# Patient Record
Sex: Female | Born: 1935 | Race: White | Hispanic: No | Smoking: Former smoker
Health system: Southern US, Community
[De-identification: ages and names within clinical notes are randomized; demographics above are authoritative.]

## PROBLEM LIST (undated history)

## (undated) DIAGNOSIS — I509 Heart failure, unspecified: Secondary | ICD-10-CM

## (undated) DIAGNOSIS — M5136 Other intervertebral disc degeneration, lumbar region: Secondary | ICD-10-CM

## (undated) DIAGNOSIS — T8859XA Other complications of anesthesia, initial encounter: Secondary | ICD-10-CM

## (undated) DIAGNOSIS — M1990 Unspecified osteoarthritis, unspecified site: Secondary | ICD-10-CM

## (undated) DIAGNOSIS — R600 Localized edema: Secondary | ICD-10-CM

## (undated) DIAGNOSIS — T4145XA Adverse effect of unspecified anesthetic, initial encounter: Secondary | ICD-10-CM

## (undated) DIAGNOSIS — Z87442 Personal history of urinary calculi: Secondary | ICD-10-CM

## (undated) DIAGNOSIS — N3090 Cystitis, unspecified without hematuria: Secondary | ICD-10-CM

## (undated) DIAGNOSIS — K4090 Unilateral inguinal hernia, without obstruction or gangrene, not specified as recurrent: Secondary | ICD-10-CM

## (undated) DIAGNOSIS — Z973 Presence of spectacles and contact lenses: Secondary | ICD-10-CM

## (undated) DIAGNOSIS — K5790 Diverticulosis of intestine, part unspecified, without perforation or abscess without bleeding: Secondary | ICD-10-CM

## (undated) DIAGNOSIS — Z8719 Personal history of other diseases of the digestive system: Secondary | ICD-10-CM

## (undated) DIAGNOSIS — H409 Unspecified glaucoma: Secondary | ICD-10-CM

## (undated) DIAGNOSIS — I771 Stricture of artery: Secondary | ICD-10-CM

## (undated) DIAGNOSIS — R3915 Urgency of urination: Secondary | ICD-10-CM

## (undated) DIAGNOSIS — I6529 Occlusion and stenosis of unspecified carotid artery: Secondary | ICD-10-CM

## (undated) DIAGNOSIS — R351 Nocturia: Secondary | ICD-10-CM

## (undated) DIAGNOSIS — R51 Headache: Secondary | ICD-10-CM

## (undated) DIAGNOSIS — K2270 Barrett's esophagus without dysplasia: Secondary | ICD-10-CM

## (undated) DIAGNOSIS — K219 Gastro-esophageal reflux disease without esophagitis: Secondary | ICD-10-CM

## (undated) DIAGNOSIS — I2510 Atherosclerotic heart disease of native coronary artery without angina pectoris: Secondary | ICD-10-CM

## (undated) DIAGNOSIS — I6501 Occlusion and stenosis of right vertebral artery: Secondary | ICD-10-CM

## (undated) DIAGNOSIS — Z9889 Other specified postprocedural states: Secondary | ICD-10-CM

## (undated) HISTORY — DX: Atherosclerotic heart disease of native coronary artery without angina pectoris: I25.10

## (undated) HISTORY — PX: TUBAL LIGATION: SHX77

## (undated) HISTORY — DX: Gastro-esophageal reflux disease without esophagitis: K21.9

## (undated) HISTORY — DX: Unspecified osteoarthritis, unspecified site: M19.90

## (undated) HISTORY — DX: Heart failure, unspecified: I50.9

---
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
## 1966-05-07 HISTORY — PX: OTHER SURGICAL HISTORY: SHX169

## 1992-05-07 HISTORY — PX: CHOLECYSTECTOMY: SHX55

## 1997-08-13 ENCOUNTER — Ambulatory Visit (HOSPITAL_COMMUNITY): Admission: RE | Admit: 1997-08-13 | Discharge: 1997-08-13 | Payer: Self-pay | Admitting: Family Medicine

## 1997-08-20 ENCOUNTER — Other Ambulatory Visit: Admission: RE | Admit: 1997-08-20 | Discharge: 1997-08-20 | Payer: Self-pay | Admitting: Family Medicine

## 1998-03-11 ENCOUNTER — Ambulatory Visit (HOSPITAL_COMMUNITY): Admission: RE | Admit: 1998-03-11 | Discharge: 1998-03-11 | Payer: Self-pay | Admitting: *Deleted

## 1998-03-11 ENCOUNTER — Other Ambulatory Visit: Admission: RE | Admit: 1998-03-11 | Discharge: 1998-03-11 | Payer: Self-pay | Admitting: *Deleted

## 1998-04-08 ENCOUNTER — Ambulatory Visit (HOSPITAL_COMMUNITY): Admission: RE | Admit: 1998-04-08 | Discharge: 1998-04-08 | Payer: Self-pay | Admitting: Family Medicine

## 1998-04-14 ENCOUNTER — Ambulatory Visit (HOSPITAL_COMMUNITY): Admission: RE | Admit: 1998-04-14 | Discharge: 1998-04-14 | Payer: Self-pay | Admitting: Family Medicine

## 1998-04-14 ENCOUNTER — Encounter: Payer: Self-pay | Admitting: Family Medicine

## 1998-04-19 ENCOUNTER — Ambulatory Visit (HOSPITAL_COMMUNITY): Admission: RE | Admit: 1998-04-19 | Discharge: 1998-04-19 | Payer: Self-pay | Admitting: Family Medicine

## 1998-07-07 ENCOUNTER — Ambulatory Visit (HOSPITAL_COMMUNITY): Admission: RE | Admit: 1998-07-07 | Discharge: 1998-07-07 | Payer: Self-pay | Admitting: Family Medicine

## 1999-03-10 ENCOUNTER — Ambulatory Visit (HOSPITAL_COMMUNITY): Admission: RE | Admit: 1999-03-10 | Discharge: 1999-03-10 | Payer: Self-pay | Admitting: *Deleted

## 1999-04-11 ENCOUNTER — Ambulatory Visit (HOSPITAL_COMMUNITY): Admission: RE | Admit: 1999-04-11 | Discharge: 1999-04-11 | Payer: Self-pay | Admitting: Family Medicine

## 1999-05-08 HISTORY — PX: CATARACT EXTRACTION W/ INTRAOCULAR LENS  IMPLANT, BILATERAL: SHX1307

## 2000-05-17 ENCOUNTER — Ambulatory Visit (HOSPITAL_COMMUNITY): Admission: RE | Admit: 2000-05-17 | Discharge: 2000-05-17 | Payer: Self-pay | Admitting: Obstetrics & Gynecology

## 2000-05-17 ENCOUNTER — Ambulatory Visit (HOSPITAL_COMMUNITY): Admission: RE | Admit: 2000-05-17 | Discharge: 2000-05-17 | Payer: Self-pay | Admitting: Family Medicine

## 2000-05-21 ENCOUNTER — Encounter: Admission: RE | Admit: 2000-05-21 | Discharge: 2000-05-21 | Payer: Self-pay | Admitting: Family Medicine

## 2000-05-21 ENCOUNTER — Encounter: Payer: Self-pay | Admitting: Family Medicine

## 2001-03-04 ENCOUNTER — Encounter: Admission: RE | Admit: 2001-03-04 | Discharge: 2001-04-24 | Payer: Self-pay | Admitting: Orthopedic Surgery

## 2001-03-21 ENCOUNTER — Ambulatory Visit (HOSPITAL_COMMUNITY): Admission: RE | Admit: 2001-03-21 | Discharge: 2001-03-21 | Payer: Self-pay | Admitting: Obstetrics & Gynecology

## 2001-05-23 ENCOUNTER — Ambulatory Visit (HOSPITAL_COMMUNITY): Admission: RE | Admit: 2001-05-23 | Discharge: 2001-05-23 | Payer: Self-pay | Admitting: Obstetrics & Gynecology

## 2002-03-13 ENCOUNTER — Ambulatory Visit (HOSPITAL_COMMUNITY): Admission: RE | Admit: 2002-03-13 | Discharge: 2002-03-13 | Payer: Self-pay | Admitting: *Deleted

## 2002-05-22 ENCOUNTER — Ambulatory Visit (HOSPITAL_COMMUNITY): Admission: RE | Admit: 2002-05-22 | Discharge: 2002-05-22 | Payer: Self-pay | Admitting: *Deleted

## 2002-09-28 ENCOUNTER — Encounter: Admission: RE | Admit: 2002-09-28 | Discharge: 2002-09-28 | Payer: Self-pay | Admitting: Family Medicine

## 2002-09-28 ENCOUNTER — Encounter: Payer: Self-pay | Admitting: Family Medicine

## 2003-02-09 ENCOUNTER — Encounter: Payer: Self-pay | Admitting: Family Medicine

## 2003-02-09 ENCOUNTER — Encounter: Admission: RE | Admit: 2003-02-09 | Discharge: 2003-02-09 | Payer: Self-pay | Admitting: Family Medicine

## 2003-12-20 ENCOUNTER — Other Ambulatory Visit: Admission: RE | Admit: 2003-12-20 | Discharge: 2003-12-20 | Payer: Self-pay | Admitting: Family Medicine

## 2004-09-15 ENCOUNTER — Encounter: Admission: RE | Admit: 2004-09-15 | Discharge: 2004-09-15 | Payer: Self-pay | Admitting: Family Medicine

## 2004-12-15 ENCOUNTER — Ambulatory Visit (HOSPITAL_COMMUNITY): Admission: RE | Admit: 2004-12-15 | Discharge: 2004-12-15 | Payer: Self-pay | Admitting: Family Medicine

## 2005-07-27 ENCOUNTER — Encounter: Admission: RE | Admit: 2005-07-27 | Discharge: 2005-07-27 | Payer: Self-pay | Admitting: Family Medicine

## 2005-08-16 ENCOUNTER — Encounter: Admission: RE | Admit: 2005-08-16 | Discharge: 2005-08-16 | Payer: Self-pay | Admitting: Family Medicine

## 2005-09-03 ENCOUNTER — Ambulatory Visit: Payer: Self-pay | Admitting: Gastroenterology

## 2005-09-07 ENCOUNTER — Encounter (INDEPENDENT_AMBULATORY_CARE_PROVIDER_SITE_OTHER): Payer: Self-pay | Admitting: *Deleted

## 2005-09-07 ENCOUNTER — Ambulatory Visit: Payer: Self-pay | Admitting: Gastroenterology

## 2005-10-08 ENCOUNTER — Ambulatory Visit: Payer: Self-pay | Admitting: Gastroenterology

## 2005-10-23 ENCOUNTER — Ambulatory Visit: Payer: Self-pay | Admitting: Gastroenterology

## 2005-10-23 DIAGNOSIS — K222 Esophageal obstruction: Secondary | ICD-10-CM

## 2005-10-23 DIAGNOSIS — K449 Diaphragmatic hernia without obstruction or gangrene: Secondary | ICD-10-CM | POA: Insufficient documentation

## 2005-11-22 ENCOUNTER — Ambulatory Visit: Payer: Self-pay | Admitting: Gastroenterology

## 2006-02-04 ENCOUNTER — Encounter: Admission: RE | Admit: 2006-02-04 | Discharge: 2006-02-04 | Payer: Self-pay | Admitting: Family Medicine

## 2006-02-07 ENCOUNTER — Ambulatory Visit (HOSPITAL_COMMUNITY): Admission: RE | Admit: 2006-02-07 | Discharge: 2006-02-07 | Payer: Self-pay | Admitting: Family Medicine

## 2006-02-12 ENCOUNTER — Other Ambulatory Visit: Admission: RE | Admit: 2006-02-12 | Discharge: 2006-02-12 | Payer: Self-pay | Admitting: Family Medicine

## 2006-04-11 ENCOUNTER — Encounter: Admission: RE | Admit: 2006-04-11 | Discharge: 2006-04-11 | Payer: Self-pay | Admitting: Family Medicine

## 2006-08-14 IMAGING — CR DG CERVICAL SPINE COMPLETE 4+V
5 series · 5 of 5 positions shown · non-contrast
Comparison: None.

CLINICAL DATA: 82-year-old female, with right-sided neck pain. 
 CERVICAL SPINE ? 4 VIEW:

[view not recorded (1 of 5)]
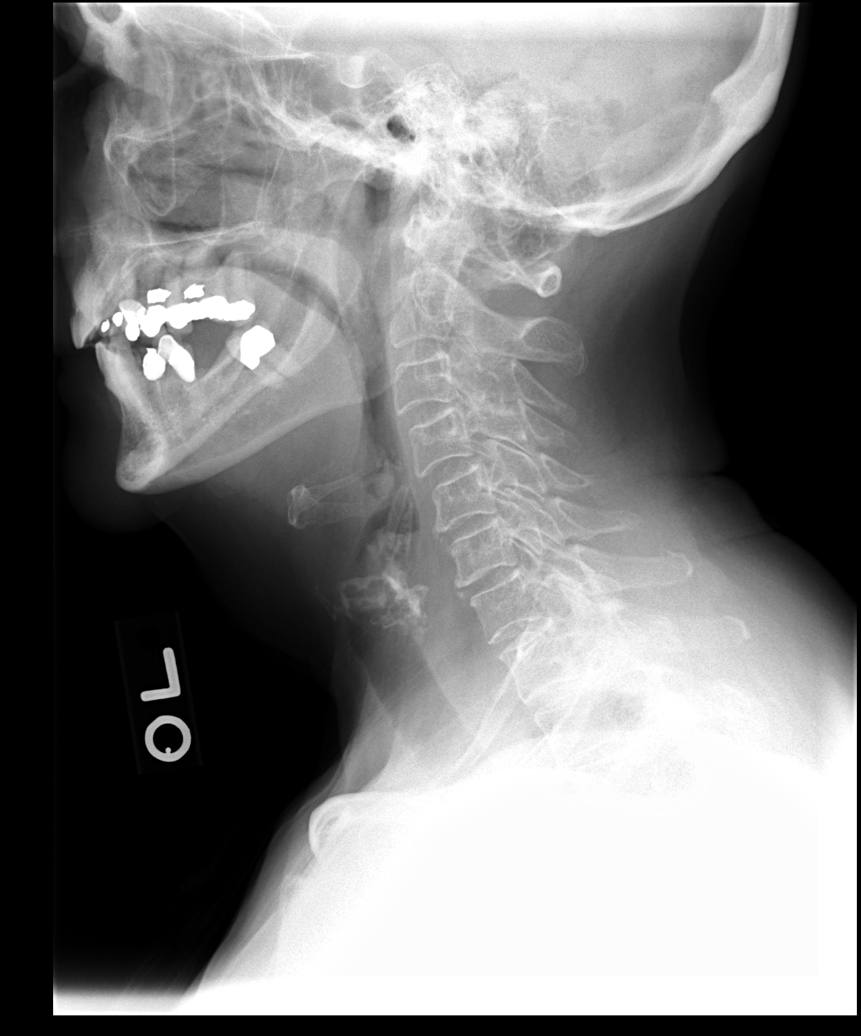

[view not recorded (2 of 5)]
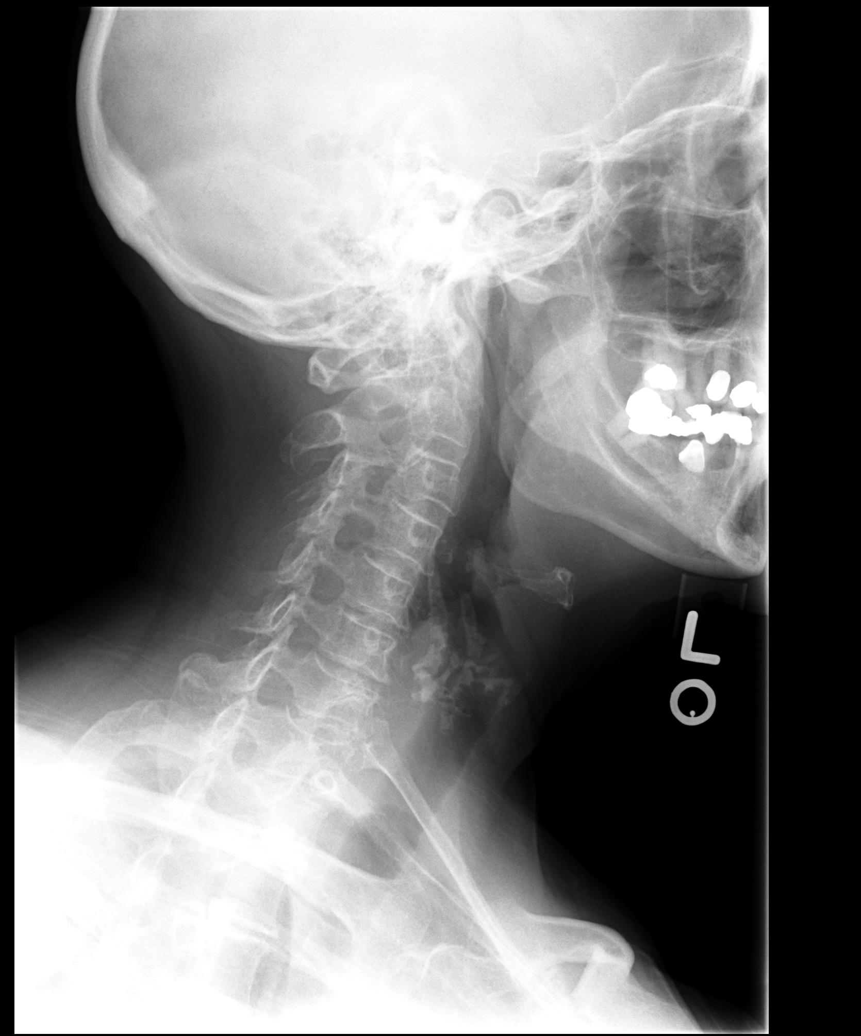

[view not recorded (3 of 5)]
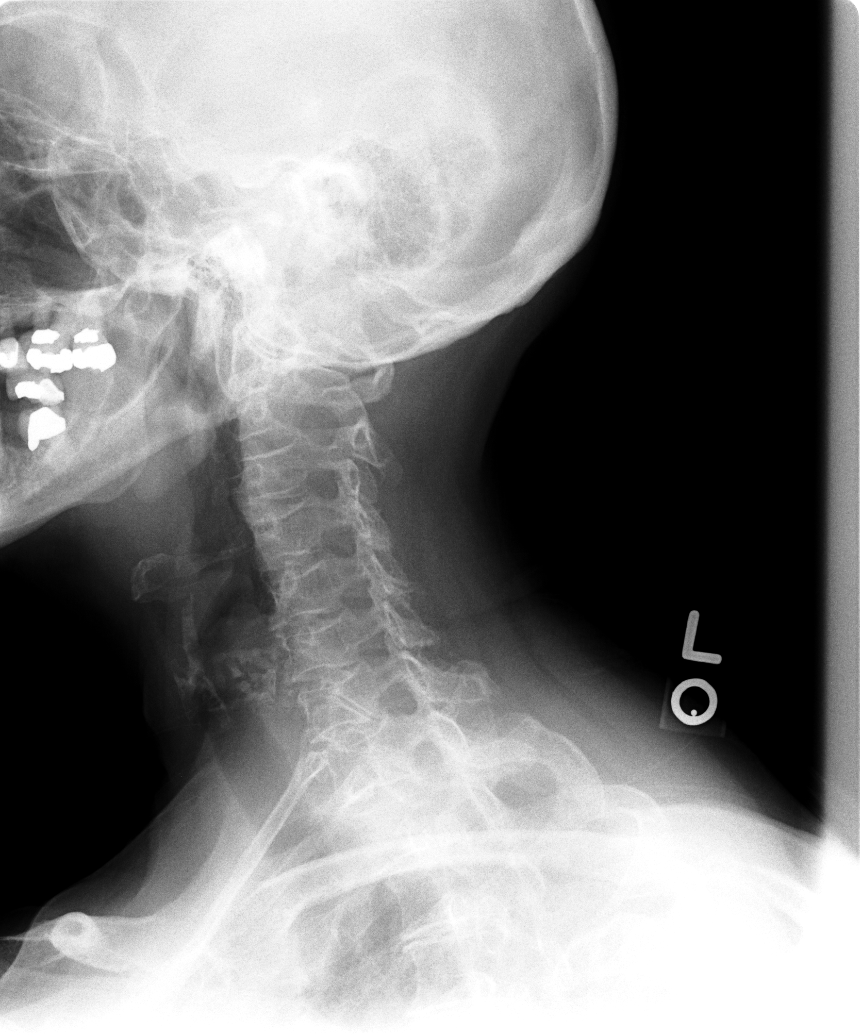

[view not recorded (4 of 5)]
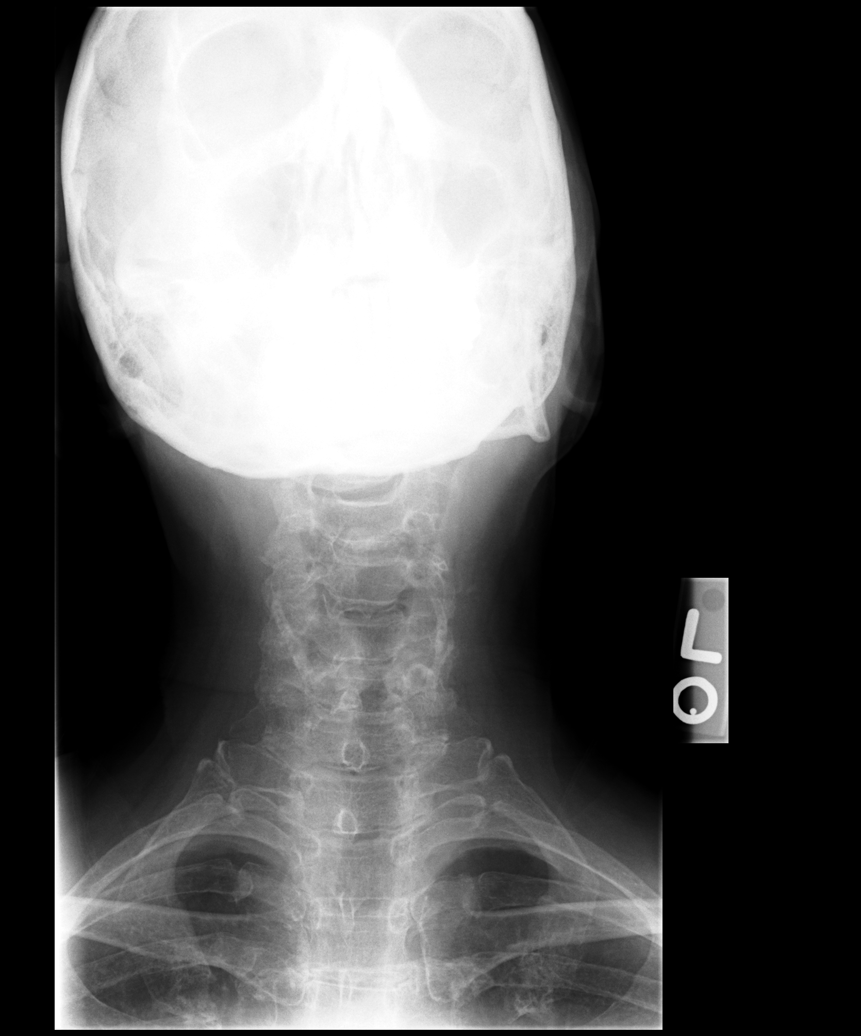

[view not recorded (5 of 5)]
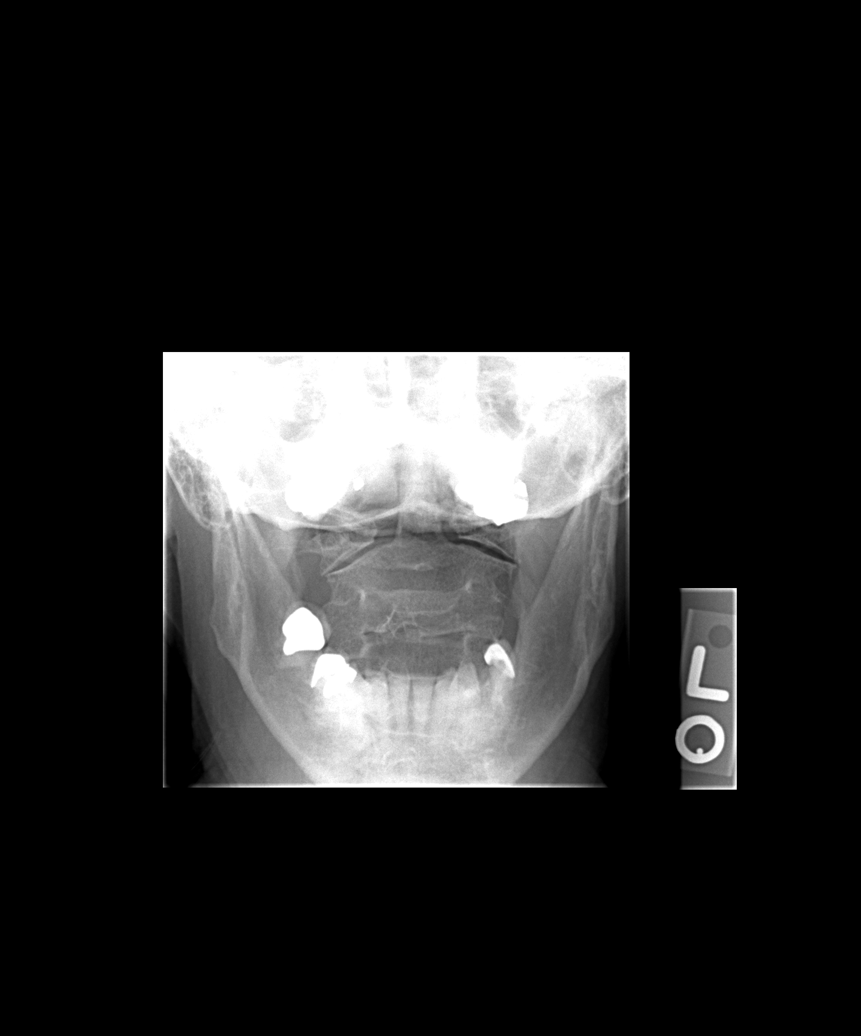

[5 of 5 positions shown; findings below may reference images not displayed]

FINDINGS: Mild osseous narrowing is present in the right neural foramen at C3-4.  No significant left-sided osseous foraminal narrowing is appreciated.  There is slight anterolisthesis of C-4 on C-5 and some loss of disc height at both C5-6 and C6-7.  The prevertebral soft tissues are normal.  Vertebral body heights are maintained.
IMPRESSION: 1.  Mild midcervical spondylosis.
 2.  Grade I anterolisthesis of C-4 on C-5.
 3.  Mild osseous narrowing right neural foramen at C3-4.

## 2006-08-26 ENCOUNTER — Encounter: Admission: RE | Admit: 2006-08-26 | Discharge: 2006-08-26 | Payer: Self-pay | Admitting: Family Medicine

## 2006-11-28 ENCOUNTER — Ambulatory Visit: Payer: Self-pay | Admitting: Gastroenterology

## 2006-12-30 ENCOUNTER — Emergency Department (HOSPITAL_COMMUNITY): Admission: EM | Admit: 2006-12-30 | Discharge: 2006-12-30 | Payer: Self-pay | Admitting: Emergency Medicine

## 2007-01-09 ENCOUNTER — Emergency Department (HOSPITAL_COMMUNITY): Admission: EM | Admit: 2007-01-09 | Discharge: 2007-01-09 | Payer: Self-pay | Admitting: Emergency Medicine

## 2007-03-14 ENCOUNTER — Ambulatory Visit (HOSPITAL_COMMUNITY): Admission: RE | Admit: 2007-03-14 | Discharge: 2007-03-14 | Payer: Self-pay | Admitting: Family Medicine

## 2007-06-06 ENCOUNTER — Encounter: Admission: RE | Admit: 2007-06-06 | Discharge: 2007-06-06 | Payer: Self-pay | Admitting: Family Medicine

## 2007-08-21 DIAGNOSIS — K219 Gastro-esophageal reflux disease without esophagitis: Secondary | ICD-10-CM

## 2007-11-26 ENCOUNTER — Ambulatory Visit: Payer: Self-pay | Admitting: Gastroenterology

## 2007-11-26 DIAGNOSIS — M129 Arthropathy, unspecified: Secondary | ICD-10-CM | POA: Insufficient documentation

## 2007-11-26 DIAGNOSIS — R1319 Other dysphagia: Secondary | ICD-10-CM

## 2007-11-28 ENCOUNTER — Ambulatory Visit (HOSPITAL_COMMUNITY): Admission: RE | Admit: 2007-11-28 | Discharge: 2007-11-28 | Payer: Self-pay | Admitting: Gastroenterology

## 2007-12-15 ENCOUNTER — Encounter: Payer: Self-pay | Admitting: Gastroenterology

## 2008-01-08 ENCOUNTER — Encounter: Payer: Self-pay | Admitting: Gastroenterology

## 2008-02-02 ENCOUNTER — Encounter: Payer: Self-pay | Admitting: Gastroenterology

## 2008-03-16 ENCOUNTER — Ambulatory Visit (HOSPITAL_COMMUNITY): Admission: RE | Admit: 2008-03-16 | Discharge: 2008-03-16 | Payer: Self-pay | Admitting: Family Medicine

## 2008-05-27 ENCOUNTER — Ambulatory Visit (HOSPITAL_COMMUNITY): Admission: RE | Admit: 2008-05-27 | Discharge: 2008-05-27 | Payer: Self-pay | Admitting: Otolaryngology

## 2008-07-21 ENCOUNTER — Encounter: Payer: Self-pay | Admitting: Gastroenterology

## 2008-07-30 ENCOUNTER — Encounter (INDEPENDENT_AMBULATORY_CARE_PROVIDER_SITE_OTHER): Payer: Self-pay | Admitting: Otolaryngology

## 2008-07-30 ENCOUNTER — Ambulatory Visit (HOSPITAL_COMMUNITY): Admission: RE | Admit: 2008-07-30 | Discharge: 2008-07-31 | Payer: Self-pay | Admitting: Otolaryngology

## 2008-07-30 HISTORY — PX: DIRECT LARYNGOSCOPY: SHX5326

## 2008-10-21 ENCOUNTER — Ambulatory Visit: Payer: Self-pay | Admitting: Gastroenterology

## 2008-10-21 DIAGNOSIS — K4090 Unilateral inguinal hernia, without obstruction or gangrene, not specified as recurrent: Secondary | ICD-10-CM | POA: Insufficient documentation

## 2008-12-02 ENCOUNTER — Encounter: Admission: RE | Admit: 2008-12-02 | Discharge: 2008-12-02 | Payer: Self-pay | Admitting: Family Medicine

## 2009-01-03 ENCOUNTER — Ambulatory Visit (HOSPITAL_COMMUNITY): Admission: RE | Admit: 2009-01-03 | Discharge: 2009-01-03 | Payer: Self-pay | Admitting: General Surgery

## 2009-01-03 ENCOUNTER — Encounter (INDEPENDENT_AMBULATORY_CARE_PROVIDER_SITE_OTHER): Payer: Self-pay | Admitting: General Surgery

## 2009-01-03 HISTORY — PX: TEMPORAL ARTERY BIOPSY / LIGATION: SUR132

## 2009-02-23 ENCOUNTER — Encounter: Payer: Self-pay | Admitting: Gastroenterology

## 2009-03-17 ENCOUNTER — Ambulatory Visit (HOSPITAL_COMMUNITY): Admission: RE | Admit: 2009-03-17 | Discharge: 2009-03-17 | Payer: Self-pay | Admitting: Family Medicine

## 2009-12-27 ENCOUNTER — Encounter: Admission: RE | Admit: 2009-12-27 | Discharge: 2009-12-27 | Payer: Self-pay | Admitting: Otolaryngology

## 2010-01-13 ENCOUNTER — Encounter: Payer: Self-pay | Admitting: Gastroenterology

## 2010-03-08 ENCOUNTER — Ambulatory Visit: Payer: Self-pay | Admitting: Gastroenterology

## 2010-03-08 DIAGNOSIS — R05 Cough: Secondary | ICD-10-CM | POA: Insufficient documentation

## 2010-03-20 ENCOUNTER — Ambulatory Visit (HOSPITAL_COMMUNITY): Admission: RE | Admit: 2010-03-20 | Discharge: 2010-03-20 | Payer: Self-pay | Admitting: Family Medicine

## 2010-05-28 ENCOUNTER — Encounter: Payer: Self-pay | Admitting: Family Medicine

## 2010-06-08 NOTE — Procedures (Signed)
Summary: EGD w/ biopsy   EGD  Procedure date:  09/07/2005  Findings:      Findings: Stricture, Hiatal Hernia, GERD Location: [facility]   Patient Name: Linda Donaldson, Linda Donaldson MRN:  Procedure Procedures: Panendoscopy (EGD) CPT: 43235.    with biopsy(s)/brushing(s). CPT: D1846139.    with esophageal dilation. CPT: G9296129.  Personnel: Endoscopist: Venita Lick. Russella Dar, MD, [facility] Graham.  Referred By: Barbera Setters Artis Flock, MD.  Exam Location: Exam performed in Outpatient Clinic. Outpatient  Patient Consent: Procedure, Alternatives, Risks and Benefits discussed, consent obtained, from patient. Consent was obtained by the RN.  Indications Symptoms: Dysphagia. Reflux symptoms  History  Current Medications: Patient is not currently taking Coumadin.  Pre-Exam Physical: Performed Sep 07, 2005  Cardio-pulmonary exam, HEENT exam, Abdominal exam, Mental status exam WNL.  Comments: Pt. history reviewed/updated, physical exam performed prior to initiation of sedation?Yes Exam Exam Info: Maximum depth of insertion Duodenum, intended Duodenum. Vocal cords not visualized. Gastric retroflexion performed. ASA Classification: II. Tolerance: excellent.  Sedation Meds: Patient assessed and found to be appropriate for moderate (conscious) sedation. Fentanyl 50 mcg. given IV. Versed 3 mg. given IV. Cetacaine Spray 2 sprays given aerosolized.  Monitoring: BP and pulse monitoring done. Oximetry used. Supplemental O2 given  Findings Normal: Proximal Esophagus to Mid Esophagus.  STRICTURE / STENOSIS: Stricture in Distal Esophagus.  Lumen diameter is 11 mm. Biopsy of Stricture/Steno  taken. ICD9: Esophageal Stricture: 530.3.  - Dilation: Cardia. Savary dilator used, Diameter: 13 mm, Minimal Resistance, No Heme present on extraction. Savary dilator used, Diameter: 14 mm, Minimal Resistance, No Heme present on extraction. Savary dilator used, Diameter: 15 mm, Minimal Resistance, Minimal Heme present on  extraction. Patient tolerance excellent. Outcome: successful.  HIATAL HERNIA: Regular, 3 cms. in length. ICD9: Hernia, Hiatal: 553.3. Normal: Fundus to Duodenal 2nd Portion.   Assessment  Diagnoses: 530.3: Esophageal Stricture.  553.3: Hernia, Hiatal.  530.81: GERD.   Events  Unplanned Intervention: No unplanned interventions were required.  Unplanned Events: There were no complications. Plans Instructions: Nothing to eat or drink for 1 hour.  Clear or full liquids: 1 hour.  Medication(s): Await pathology. PPI: QAM, for indefinitely.   Patient Education: Patient given standard instructions for: Hiatal Hernia. Reflux. Stenosis / Stricture.  Disposition: After procedure patient sent to recovery. After recovery patient sent home.  Scheduling: Office Visit, to [employer]. Russella Dar, MD, [facility], around Oct 08, 2005.  This report was created from the original endoscopy report, which was reviewed and signed by the above listed endoscopist.    cc: Othelia Pulling, MD        SP Surgical Pathology - STATUS: Final             By: Almyra Free MD , Malcolm Metro        Perform Date: 4 May07 00:01  Ordered By: Rica Records Date: 5 May07 05:22  Facility: [facility]                               Department: CPATH  Service Report Text  [facility]   [address] [address]   [place], [place] [address]   Telephone [phone] or [phone] Fax [phone]    REPORT OF SURGICAL PATHOLOGY    Case #: [id number]   Patient Name: Linda Donaldson, Linda Donaldson   PID: [id number]   Pathologist: Cira Rue L. Almyra Free, MD  DOB/Age [birth year] (Age: 77) Gender: F   Date Taken: 09/07/2005   Date Received: 09/07/2005    FINAL DIAGNOSIS    ***MICROSCOPIC EXAMINATION AND DIAGNOSIS***    BIOPSY, ESOPHAGUS: BENIGN SQUAMOUS MUCOSA WITH MILD SQUAMOUS   HYPERPLASIA, SEE COMMENT.    COMMENT   There is no significant inflammatory component and no definitive   features associated with  reflux esophagitis. There is no   evidence of intestinal metaplasia, dysplasia or malignancy.    An Alcian Blue stain is performed to determine the presence of   intestinal metaplasia (goblet cell metaplasia). No intestinal   metaplasia (goblet cell metaplasia) is identified with the Alcian   Blue stain. The control stained appropriately. (ALL:caf 09/10/05)    cf   Date Reported: 09/10/2005 Arlene L. Almyra Free, MD   *** Electronically Signed Out By ALL ***    Clinical information   R/O peptic stricture (gt)    specimen(s) obtained   Esophagus, biopsy, stricture    Gross Description   Received in formalin are tan, soft tissue fragments that are   submitted in toto. Number: 2   Size: 0.3 and 0.4 cm Block summary: total in cassette   (BM:jes, 09/10/05)    jes/

## 2010-06-08 NOTE — Letter (Signed)
Summary: [facility]   Imported By: Lennie Odor 02/02/2010 16:10:57  _____________________________________________________________________  External Attachment:    Type:   Image     Comment:   External Document

## 2010-06-08 NOTE — Assessment & Plan Note (Signed)
Summary: f.u .hernia ...em   History of Present Illness Visit Type: Follow-up Visit Primary GI MD: Linda Goody MD [facility] Primary Provider: Molly Maduro Reade,MD Requesting Provider: n/a Chief Complaint: Continually dry cough, GERD History of Present Illness:   Linda Donaldson returns today complaining of a dry cough, irritation in her throat and sinus drainage. She has rare episodes of acid reflux and occasionally notes belching. I have offered her the option of changing to a stronger proton pump inhibitor in the past, but she has declined for cost reasons.   GI Review of Systems    Reports acid reflux.      Denies abdominal pain, belching, bloating, chest pain, dysphagia with liquids, dysphagia with solids, loss of appetite, nausea, vomiting, vomiting blood, weight loss, and  weight gain.      Reports diarrhea.     Denies anal fissure, black tarry stools, change in bowel habit, constipation, diverticulosis, fecal incontinence, heme positive stool, hemorrhoids, irritable bowel syndrome, jaundice, light color stool, liver problems, rectal bleeding, and  rectal pain.   Current Medications (verified): 1)  Omeprazole 20 Mg Tbec (Omeprazole) .... Take 2 Tablet By Mouth Two Times A Day 2)  Ultracet 37.5-325 Mg  Tabs (Tramadol-Acetaminophen) .... Take 1 Tablet By Mouth Two Times A Day 3)  Tylenol Extra Strength 500 Mg  Tabs (Acetaminophen) .... As Needed 4)  Zaroxolyn 5 Mg  Tabs (Metolazone) .... Take 1-3 Tablets Once Daily As Needed in The Am 5)  Klor-Con 10 10 Meq  Cr-Tabs (Potassium Chloride) .... Take 1 Tablet By Mouth Two Times A Day 6)  Zolpidem Tartrate 10 Mg Tabs (Zolpidem Tartrate) .Marland Kitchen.. 1 By Mouth At Bedtime As Needed 7)  Alprazolam 0.25 Mg Tabs (Alprazolam) .Marland Kitchen.. 1 By Mouth As Needed 8)  Naproxen Sodium 550 Mg Tabs (Naproxen Sodium) .Marland Kitchen.. 1 By Mouth Two Times A Day 9)  Furosemide 40 Mg Tabs (Furosemide) .... Once Daily 10)  Meloxicam 15 Mg Tabs (Meloxicam) .... Once Daily 11)  Tessalon  Perles 100 Mg Caps (Benzonatate) .... As Needed  Allergies: 1)  ! Cipro 2)  Penicillin V Potassium (Penicillin V Potassium)  Past History:  Past Medical History: GERD (ICD-530.81) HIATAL HERNIA (ICD-553.3) ESOPHAGEAL STRICTURE (ICD-530.3) Melanoma R maxillary fracture 12/2006 Left inguinal hernia  Past Surgical History: Reviewed history from 10/21/2008 and no changes required. 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 No FH of Colon Cancer:  Social History: Reviewed history from 11/26/2007 and no changes required. Widowed Retired Patient has never smoked.  Alcohol Use - yes-rarely Daily Caffeine Use 3-4 cups coffee  Review of Systems       The patient complains of cough and swelling of feet/legs.  The patient denies allergy/sinus, anemia, anxiety-new, arthritis/joint pain, back pain, blood in urine, breast changes/lumps, change in vision, confusion, coughing up blood, depression-new, fainting, fatigue, headaches-new, hearing problems, heart murmur, heart rhythm changes, itching, menstrual pain, muscle pains/cramps, night sweats, nosebleeds, pregnancy symptoms, shortness of breath, skin rash, sleeping problems, sore throat, swollen lymph glands, thirst - excessive , urination - excessive , urination changes/pain, urine leakage, vision changes, and voice change.    Vital Signs:  Patient profile:   77 year old female Height:      65 inches Weight:      189.25 pounds BMI:     31.61 Pulse rate:   76 / minute Pulse rhythm:   regular BP sitting:  130 / 76  (left arm) Cuff size:   regular  Vitals Entered By: Linda Donaldson CMA Linda Donaldson) (March 08, 2010 11:22 AM)  Physical Exam  General:  Well developed, well nourished, no acute distress. Head:  Normocephalic and atraumatic. Eyes:  PERRLA, no icterus. Mouth:  No deformity or  lesions, dentition normal. Lungs:  Clear throughout to auscultation. Heart:  Regular rate and rhythm; no murmurs, rubs,  or bruits. Abdomen:  Soft, nontender and nondistended. No masses, hepatosplenomegaly or hernias noted. Normal bowel sounds. Psych:  Alert and cooperative. Normal mood and affect.  Impression & Recommendations:  Problem # 1:  GERD (ICD-530.81) Her reflux symptoms appear to be under very good control. She has a prior history of esophageal stricture. I do not feel that the cough is related to reflux. However, I have offered her the option again to change to stronger proton pump inhibitor to assess symptom response. She agrees to a 4 week trial of Dexilant 60 mg daily samples but she states she will likely not want to change medications long-term. She will hold omeprazole for the time while on Dexilant. I have advised her to use TUMS or Maalox on an as-needed basis for breakthrough symptoms and to see Dr. Nicholos Johns for further evaluation of her cough.  Problem # 2:  COUGH (ICD-786.2) Further evaluation with her primary physician.  Problem # 3:  INGUINAL HERNIA (ICD-550.90) Left inguinal hernia. Conservative management is planned after consultation with Dr. Daphine Deutscher.  Patient Instructions: 1)  Hold omeprazole and start Dexilant samples one capsule by mouth once daily x 1 month.  2)  Please schedule a follow-up appointment as needed.  3)  Copy sent to : Elias Else, MD 4)  The medication list was reviewed and reconciled.  All changed / newly prescribed medications were explained.  A complete medication list was provided to the patient / caregiver.

## 2010-08-12 LAB — BASIC METABOLIC PANEL
BUN: 16 mg/dL (ref 6–23)
Chloride: 98 mEq/L (ref 96–112)
GFR calc Af Amer: >60 mL/min (ref >60)
Potassium: 3.3 mEq/L — ABNORMAL LOW (ref 3.5–5.1)

## 2010-08-12 LAB — CBC
HCT: 36 % (ref 36.0–46.0)
MCV: 86.7 fL (ref 78.0–100.0)
RBC: 4.15 MIL/uL (ref 3.87–5.11)
WBC: 6.4 10*3/uL (ref 4.0–10.5)

## 2010-08-17 LAB — BODY FLUID CULTURE

## 2010-08-17 LAB — CBC
Hemoglobin: 11 g/dL — ABNORMAL LOW (ref 12.0–15.0)
MCHC: 33.2 g/dL (ref 30.0–36.0)
MCV: 81.3 fL (ref 78.0–100.0)
RBC: 4.08 MIL/uL (ref 3.87–5.11)
WBC: 5.4 10*3/uL (ref 4.0–10.5)

## 2010-08-17 LAB — BASIC METABOLIC PANEL
CO2: 30 mEq/L (ref 19–32)
Chloride: 99 mEq/L (ref 96–112)
Creatinine, Ser: 0.68 mg/dL (ref 0.4–1.2)
GFR calc Af Amer: >60 mL/min (ref >60)
Potassium: 3 mEq/L — ABNORMAL LOW (ref 3.5–5.1)
Sodium: 138 mEq/L (ref 135–145)

## 2010-08-17 LAB — PROTIME-INR: Prothrombin Time: 16.1 seconds — ABNORMAL HIGH (ref 11.6–15.2)

## 2010-08-21 LAB — CBC
HCT: 35.2 % — ABNORMAL LOW (ref 36.0–46.0)
Hemoglobin: 11.3 g/dL — ABNORMAL LOW (ref 12.0–15.0)
MCHC: 32.1 g/dL (ref 30.0–36.0)
Platelets: 282 10*3/uL (ref 150–400)
RDW: 15.9 % — ABNORMAL HIGH (ref 11.5–15.5)

## 2010-08-21 LAB — BASIC METABOLIC PANEL
BUN: 13 mg/dL (ref 6–23)
CO2: 30 mEq/L (ref 19–32)
Calcium: 9.6 mg/dL (ref 8.4–10.5)
Glucose, Bld: 98 mg/dL (ref 70–99)
Potassium: 3 mEq/L — ABNORMAL LOW (ref 3.5–5.1)
Sodium: 138 mEq/L (ref 135–145)

## 2010-09-19 NOTE — Op Note (Signed)
Linda Donaldson, Linda Donaldson NO.:  [id number]   MEDICAL RECORD NO.:  [id number]          PATIENT TYPE:  OIB   LOCATION:  5118                         FACILITY:  [facility]   PHYSICIAN:  Lucky Cowboy, MD         DATE OF BIRTH:  04/06/1923   DATE OF PROCEDURE:  07/30/2008  DATE OF DISCHARGE:                               OPERATIVE REPORT   PREOPERATIVE DIAGNOSIS:  Right vallecular cyst.   POSTOPERATIVE DIAGNOSIS:  Right  vallecular cyst (abscess).   PROCEDURES:  Suspension micro direct laryngoscopy with cautery removal  of right vallecular cyst.   SURGEON:  Lucky Cowboy, MD   ANESTHESIA:  General.   ESTIMATED BLOOD LOSS:  Less than 20 mL.   SPECIMENS:  A 2 cm yellow pus filled right vallecular cyst.   COMPLICATIONS:  None.   INDICATIONS:  The patient is an 77 year old female who is having  significant dysphasia and notices a foreign body sensation on the right.  There has been some intermittent discomfort and pain.  This has been  present for at least 6 months.  Lately, the sore throat has been  constant.  She has been using omeprazole, for which the patient was  taken to the operating room and placed on the table in a supine  position.  She was then intubated using a #7 cuffed tube by anesthesia.  The table was rotated counterclockwise 90 degrees.  The head and body  were draped in the usual fashion.  Lubrication was placed on the lips.  An upper dental guard was placed over the upper incisors.  Moistened eye  pads were placed and then head draped and wrapped.  A Zenker's  diverticuloscope was then placed on the cyst, which was located on the  lingual surface of the epiglottis.  Moistened gauze was placed between  the scope and the endotracheal tube to prevent any transmitted heat.  The laryngoscope was then suspended on the Lewy rigid suspension arm.  The operating microscope was used to better visualize the cyst and also  during the portion of excision.  The 0-degree  Storz-Hopkins endoscope  attached to a camera was used to make photographs initially and  throughout this procedure including the specimen at the end.  The  suction cautery was used to carefully dissect off the cyst from the  lingual surface of the epiglottis.  All bleeding was controlled in this  manner.  Curved scissors were then used to divide the attachments to the  lingual surface of the epiglottis from the sac.  Sac was opened  initially to allow decompression in easier removal.  A purulent fluid  was encountered, which was thick and yellow.  A culture was obtained.  After the was removed, photographs were taken.  Lidocaine 2%, which was  plane was then used to rinse the larynx to prevent or minimize the  chance of laryngospasm.  This was suctioned out.  The laryngoscope was  removed noting no damage to the teeth or soft tissues.  The table was  rotated clockwise 90 degrees in its original position.  The patient  was  awakened from anesthesia and taken to the Post Anesthesia Care Unit in  stable condition.  There were no complications.       Lucky Cowboy, MD  Electronically Signed     SJ/MEDQ  D:  07/30/2008  T:  07/31/2008  Job:  [id number]   cc:   [facility]  Quita Skye. Artis Flock, M.D.

## 2010-09-19 NOTE — Op Note (Signed)
Linda Donaldson, Linda Donaldson NO.:  [id number]   MEDICAL RECORD NO.:  [id number]          PATIENT TYPE:  AMB   LOCATION:  SDS                          FACILITY:  [facility]   PHYSICIAN:  Almond Lint, MD       DATE OF BIRTH:  25-Mar-1923   DATE OF PROCEDURE:  01/03/2009  DATE OF DISCHARGE:  01/03/2009                               OPERATIVE REPORT   PREOPERATIVE DIAGNOSIS:  Headaches.   POSTOPERATIVE DIAGNOSIS:  Headaches.   PROCEDURE PERFORMED:  Left temporal artery biopsy.   SURGEON:  Almond Lint, MD   ASSISTANT:  None.   ANESTHESIA:  MAC and local.   FINDINGS:  Sclerotic but patent left temporal artery.   SPECIMEN:  Temporal artery segment to pathology.   ESTIMATED BLOOD LOSS:  Minimal.   COMPLICATIONS:  None known.   PROCEDURE:  Linda Donaldson was identified in the holding area and taken  to operating room where she was placed supine on the operating room  table.  MAC anesthesia was induced.  Her left temple was clipped,  prepped, and draped in a sterile fashion.  Time-out was performed  according to the surgical safety check list.  When all was correct, we  continued.  The course of the temporal artery was marked out in the  surgical field and this area was infiltrated with local anesthetic.  An  incision was made with a #15 blade.  The Metzenbaum scissors was used to  carry the dissection underneath the dermis.  The temporal artery was  skeletonized for a 2-cm segment.  The ends were clamped and ligated.  They were tied with 3-0 silk ties.  The segment was passed off the  table.  The wound was irrigated and there was several small areas of  oozing underneath the skin.  These were coagulated.  The skin was then  closed using 3-0 Vicryl interrupted deep dermal sutures and 4-0 Monocryl  subcuticular suture.  The patient was awakened from anesthesia and taken  to PACU in stable condition.  Needle and sponge counts were correct x2.      Almond Lint, MD  Electronically Signed     FB/MEDQ  D:  01/03/2009  T:  01/04/2009  Job:  [id number]

## 2010-09-22 NOTE — Assessment & Plan Note (Signed)
[facility]                         GASTROENTEROLOGY OFFICE NOTE   WILBURTA, MILBOURN                     MRN:          [id number]  DATE:11/28/2006                            DOB:          [birth year]    Mrs. Kirchhoff relates problems with frequent throat clearing and  occasionally coughing up small amounts of mucus.  She notes occasional  episodes of regurgitation, and occasionally these symptoms are  nocturnal.  She notes intermittent episodes of mild epigastric pain.  She is currently taking omeprazole 20 mg twice a day.  She has no  dysphagia or odynophagia.  She underwent endoscopy in June of 2007, with  dilation of a peptic stricture.  A hiatal hernia was also noted.   CURRENT MEDICATIONS:  Listed on the chart updated and reviewed.   MEDICATION ALLERGIES:  PENICILLIN LEADING TO HIVES.   EXAMINATION:  VITAL SIGNS:  No acute distress.  Weight 192 pounds, blood  pressures 112/62, pulse 64 and regular.  HEENT:  Anicteric sclerae, oropharynx clear.  NECK:  Without thyromegaly or adenopathy appreciated.  CHEST:  Clear to auscultation bilaterally.  CARDIAC:  Regular rate and rhythm without murmurs appreciated.  ABDOMEN:  Soft, nontender, nondistended, normoactive bowel sounds.   ASSESSMENT/PLAN:  Gastroesophageal reflux disease with only fair to good  symptom control.  She may be having some LPR symptoms.  She is to re-  intensify all anti-reflux measures, discontinue omeprazole and begin  Nexium 40 mg p.o. q.a.m.  If all the above symptoms do not come under  good control within the next 6 to 8 weeks, she is to call back for  further followup.  If they are under good control, she will return for  followup in 12 months.     Venita Lick. Russella Dar, MD, [facility]  Electronically Signed    MTS/MedQ  DD: 11/29/2006  DT: 11/30/2006  Job #: [id number]   cc:   Quita Skye. Artis Flock, M.D.

## 2010-09-22 NOTE — Assessment & Plan Note (Signed)
[facility]                           GASTROENTEROLOGY OFFICE NOTE   NAME:Hanssen, MURL ZOGG                     MRN:          [id number]  DATE:11/22/2005                            DOB:          Aug 13, 1922    REASON FOR VISIT:  Peptic stricture.  Dilation to 17 mm was accomplished on  June 19.  She has had some intermittent mild epigastric discomfort but she  has no ongoing reflux symptoms and no solid food dysphagia.  She does have a  slightly difficult time swallowing K-Dur and Darvocet occasionally.  She has  concerns about a nodule in the right peritonsillar region of her throat.  It  is not painful but she does notice it.   CURRENT MEDICATIONS:  Listed on the chart updated and reviewed/   ALLERGIES:  PENICILLIN leading to hives.   EXAMINATION:  GENERAL  In no acute distress.  VITAL SIGNS:  Weight 186.8.  Blood pressure is 108/64.  Pulse 78 and  regular.  HEENT:  Reveals a small, smooth nodule in her right peritonsillar region  that measures approximately 3 mm by estimation.  CHEST:  Clear to auscultation bilaterally.  CARDIAC:  Regular rate and rhythm without murmurs.  ABDOMEN:  Soft and non-tender with normal active bowel sounds.  A small left  inguinal hernia is noted on standing.   ASSESSMENT AND PLAN:  1.  Resolved dysphagia.  Maintain standard anti-reflux measures and      Omeprazole 20 mg p.o. b.i.d. taken 1/2 hour before breakfast and dinner.      Return as needed with planned office visit in one year.  2.  Right peritonsillar nodule.  Ear, nose and throat referral for further      evaluation.  She states she will also have Dr. Artis Flock evaluate this      problem at her next office visit.                                   Venita Lick. Pleas Koch., MD, [facility]   MTS/MedQ  DD:  11/22/2005  DT:  11/22/2005  Job #:  [id number]   cc:   Quita Skye. Artis Flock, MD

## 2011-02-20 ENCOUNTER — Other Ambulatory Visit (HOSPITAL_COMMUNITY): Payer: Self-pay | Admitting: Family Medicine

## 2011-02-20 DIAGNOSIS — Z1231 Encounter for screening mammogram for malignant neoplasm of breast: Secondary | ICD-10-CM

## 2011-03-22 ENCOUNTER — Ambulatory Visit (HOSPITAL_COMMUNITY)
Admission: RE | Admit: 2011-03-22 | Discharge: 2011-03-22 | Disposition: A | Discharge status: Home / Self Care | Payer: Medicare Other | Source: Ambulatory Visit | Attending: Family Medicine | Admitting: Family Medicine

## 2011-03-22 DIAGNOSIS — Z1231 Encounter for screening mammogram for malignant neoplasm of breast: Secondary | ICD-10-CM | POA: Insufficient documentation

## 2012-02-21 ENCOUNTER — Other Ambulatory Visit (HOSPITAL_COMMUNITY): Payer: Self-pay | Admitting: Family Medicine

## 2012-02-21 DIAGNOSIS — Z1231 Encounter for screening mammogram for malignant neoplasm of breast: Secondary | ICD-10-CM

## 2012-03-24 ENCOUNTER — Ambulatory Visit (HOSPITAL_COMMUNITY)
Admission: RE | Admit: 2012-03-24 | Discharge: 2012-03-24 | Disposition: A | Discharge status: Home / Self Care | Payer: Medicare Other | Source: Ambulatory Visit | Attending: Family Medicine | Admitting: Family Medicine

## 2012-03-24 DIAGNOSIS — Z1231 Encounter for screening mammogram for malignant neoplasm of breast: Secondary | ICD-10-CM

## 2012-09-11 ENCOUNTER — Ambulatory Visit (INDEPENDENT_AMBULATORY_CARE_PROVIDER_SITE_OTHER): Payer: Medicare Other | Admitting: Diagnostic Neuroimaging

## 2012-09-11 ENCOUNTER — Encounter: Payer: Self-pay | Admitting: Diagnostic Neuroimaging

## 2012-09-11 VITALS — BP 150/80 | HR 83 | Temp 97.8°F | Ht 63.0 in | Wt 187.0 lb

## 2012-09-11 DIAGNOSIS — G459 Transient cerebral ischemic attack, unspecified: Secondary | ICD-10-CM

## 2012-09-11 DIAGNOSIS — H539 Unspecified visual disturbance: Secondary | ICD-10-CM

## 2012-09-11 NOTE — Patient Instructions (Signed)
I will order MRI, MRA scans.

## 2012-09-11 NOTE — Progress Notes (Signed)
[facility]  PATIENT: Linda Donaldson DOB: 1922/11/28  REFERRING CLINICIAN: Pickett HISTORY FROM: patient REASON FOR VISIT: new consult   HISTORICAL  CHIEF COMPLAINT:  Chief Complaint  Patient presents with  . Eye Problem    Bilateral paritial vision.Marland KitchenNp 6    HISTORY OF PRESENT ILLNESS:   79 year old right-handed female with history of anxiety, acid reflux disease, insomnia, here for evaluation of abnormal visual disturbance.  For past one year, patient is having intermittent episodes of visual disturbance, usually starting on the right side of her visual field and spreading across towards the midline. Sometimes this occurs in the left side of the visual field and spreads towards the midline. She sees wavy lines, and sometimes a teardrop shaped. His last for 15-20 minutes and then resolves. No triggering or alleviating factors. No headache, eye pain, nausea, vomiting, numbness, weakness, slurred speech or trouble talking. No lightheadedness, dizziness vertigo or passing out.  Patient has never had migraine headaches in the past. She has seen her ophthalmologist who found no specific ocular cause.  REVIEW OF SYSTEMS: Full 14 system review of systems performed and notable only for eye pain headache dizziness.  ALLERGIES: Allergies  Allergen Reactions  . Ciprofloxacin   . Penicillins     REACTION: unspecified    HOME MEDICATIONS: No outpatient prescriptions prior to visit.   No facility-administered medications prior to visit.    PAST MEDICAL HISTORY: Past Medical History  Diagnosis Date  . Insomnia   . GERD (gastroesophageal reflux disease)   . Glaucoma   . Dysphasia   . Arthritis   . Vallecular cyst     right  . Hemorrhoids     PAST SURGICAL HISTORY: Past Surgical History  Procedure Laterality Date  . Cholecystectomy      FAMILY HISTORY: Family History  Problem Relation Age of Onset  . Cancer Mother     SOCIAL  HISTORY:  History   Social History  . Marital Status: Widowed    Spouse Name: N/A    Number of Children: 0  . Years of Education: N/A   Occupational History  . Not on file.   Social History Main Topics  . Smoking status: Never Smoker   . Smokeless tobacco: Never Used  . Alcohol Use: No  . Drug Use: No  . Sexually Active: Not on file   Other Topics Concern  . Not on file   Social History Narrative   Pt lives at home alone.   Caffeine Use: Does drink coffee.     PHYSICAL EXAM  Filed Vitals:   09/11/12 1318  BP: 150/80  Pulse: 83  Temp: 97.8 F (36.6 C)  TempSrc: Oral  Height: 5\' 3"  (1.6 m)  Weight: 187 lb (84.823 kg)   Body mass index is 33.13 kg/(m^2).  GENERAL EXAM: Patient is in no distress  CARDIOVASCULAR: Regular rate and rhythm, no murmurs, no carotid bruits  NEUROLOGIC: MENTAL STATUS: awake, alert, language fluent, comprehension intact, naming intact CRANIAL NERVE: no papilledema on fundoscopic exam, pupils RIGHT 3, LEFT 2, BOTH REACTIVE TO LIGHT. Visual fields full to confrontation, extraocular muscles intact, no nystagmus, facial sensation and strength symmetric, uvula midline, shoulder shrug symmetric, tongue midline. MOTOR: normal bulk and tone, full strength in the BUE, BLE SENSORY: ABSENT VIBRATION AT TOES, ANKLES AND KNEES. DECR PP IN BLE.  COORDINATION: finger-nose-finger, fine finger movements normal REFLEXES: BUE TRACE, KNEES TRACE, ANKLES 0. GAIT/STATION: narrow based gait; able to walk on toes, heels and tandem; romberg  is negative   DIAGNOSTIC DATA (LABS, IMAGING, TESTING) - I reviewed patient records, labs, notes, testing and imaging myself where available.  Lab Results  Component Value Date   WBC 6.4 01/03/2009   HGB 12.2 01/03/2009   HCT 36.0 01/03/2009   MCV 86.7 01/03/2009   PLT 222 01/03/2009      Component Value Date/Time   NA 136 01/03/2009 0858   K 3.3* 01/03/2009 0858   CL 98 01/03/2009 0858   CO2 30 01/03/2009 0858    GLUCOSE 104* 01/03/2009 0858   BUN 16 01/03/2009 0858   CREATININE 0.76 01/03/2009 0858   CALCIUM 9.5 01/03/2009 0858   GFRNONAA >60 01/03/2009 0858   GFRAA  Value: >60        The eGFR has been calculated using the MDRD equation. This calculation has not been validated in all clinical situations. eGFR's persistently <60 mL/min signify possible Chronic Kidney Disease. 01/03/2009 0858   No results found for this basename: CHOL, HDL, LDLCALC, LDLDIRECT, TRIG, CHOLHDL   No results found for this basename: HGBA1C   No results found for this basename: VITAMINB12   No results found for this basename: TSH     ASSESSMENT AND PLAN  79 y.o. year old female  has a past medical history of Insomnia; GERD (gastroesophageal reflux disease); Glaucoma; Dysphasia; Arthritis; Vallecular cyst; and Hemorrhoids. here with frequent episodes of visual disturbance. By description these sound like migraine phenomenon. However I would like to rule out intracranial pathology with MRI of the brain. I will check MRA head and neck to evaluate for posterior circulation TIA.   Orders Placed This Encounter  Procedures  . MR Brain Wo Contrast  . MR MRA HEAD WO CONTRAST  . MR Angiogram Neck W Wo Contrast      Suanne Marker, MD 09/11/2012, 1:39 PM Certified in Neurology, Neurophysiology and Neuroimaging  [facility] [address] [place], [place] [address] [phone]

## 2012-09-23 ENCOUNTER — Other Ambulatory Visit: Payer: Medicare Other

## 2012-09-24 ENCOUNTER — Ambulatory Visit
Admission: RE | Admit: 2012-09-24 | Discharge: 2012-09-24 | Disposition: A | Discharge status: Home / Self Care | Payer: Medicare Other | Source: Ambulatory Visit | Attending: Diagnostic Neuroimaging | Admitting: Diagnostic Neuroimaging

## 2012-09-24 DIAGNOSIS — G459 Transient cerebral ischemic attack, unspecified: Secondary | ICD-10-CM

## 2012-09-24 DIAGNOSIS — H539 Unspecified visual disturbance: Secondary | ICD-10-CM

## 2012-10-02 ENCOUNTER — Ambulatory Visit
Admission: RE | Admit: 2012-10-02 | Discharge: 2012-10-02 | Disposition: A | Discharge status: Home / Self Care | Payer: Medicare Other | Source: Ambulatory Visit | Attending: Diagnostic Neuroimaging | Admitting: Diagnostic Neuroimaging

## 2012-10-02 DIAGNOSIS — G459 Transient cerebral ischemic attack, unspecified: Secondary | ICD-10-CM

## 2012-10-02 MED ORDER — GADOBENATE DIMEGLUMINE 529 MG/ML IV SOLN
17.0000 mL | Freq: Once | INTRAVENOUS | Status: AC | PRN
  Administered 2012-10-02: 17 mL via INTRAVENOUS

## 2012-10-14 ENCOUNTER — Telehealth: Payer: Self-pay | Admitting: Diagnostic Neuroimaging

## 2012-10-14 NOTE — Telephone Encounter (Signed)
Pt called wants results from her MRI. Thanks

## 2012-10-14 NOTE — Telephone Encounter (Signed)
Pt requesting MRI results. Advised message received and fwd to Dr. Marjory Lies. Pt agreed.

## 2012-10-15 ENCOUNTER — Telehealth: Payer: Self-pay | Admitting: Diagnostic Neuroimaging

## 2012-10-15 NOTE — Telephone Encounter (Signed)
I called patient and reviewed results.   Abnormal MRI brain - Moderate periventricular and subcortical and pontine chronic small vessel ischemic disease. No acute findings.  Abnormal MRA head (without) demonstrating: 1. Right vertebral artery not visualized, likely due to proximal occlusion. 2. Moderate diffuse atherosclerosis on bilateral posterior cerebral arteries.    Abnormal MRA neck (with and without) demonstrating: 1. Right vertebral artery has no anterograde flow. There is mild retrograde filling via the left vertebral artery. This may represent proximal right vertebral artery occlusion/stenosis vs subclavian steal syndrome due to stenosis of the brachiocephalic trunk just proximal to the common carotid artery origin, which also has focal stenosis. 2. Bilateral internal carotid and left vertebral arteries have no stenosis.   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 Given age, and lack of acute stroke and lack of subclavian steal clinical symptoms in the right arm, will treat medically. Start aspirin 81mg  daily. Will ask her to follow up with PCP for diabetes and lipid screening. Discussed angiogram and stenting options, but given her age, she and I are both reluctant.   Suanne Marker, MD 10/15/2012, 8:57 AM Certified in Neurology, Neurophysiology and Neuroimaging  [facility] [address] [place], [place] [address] [phone]

## 2013-02-06 ENCOUNTER — Other Ambulatory Visit: Payer: Self-pay | Admitting: Urology

## 2013-02-19 ENCOUNTER — Encounter (HOSPITAL_BASED_OUTPATIENT_CLINIC_OR_DEPARTMENT_OTHER): Payer: Self-pay | Admitting: *Deleted

## 2013-02-19 NOTE — Progress Notes (Signed)
NPO AFTER MN. ARRIVES AT 0900. NEEDS ISTAT 8. WILL TAKE PRILOSEC AM DOS W/ SIP OF WATER AND IF NEEDED MAY TAKE TRAMADOL OR TYLENOL.

## 2013-02-23 NOTE — H&P (Signed)
History of Present Illness   Ms. Teehan is on daily trimethoprim for chronic cystitis. My plan was to recystoscope her. She was on vaginal Estrace cream for vaginal pain. When I saw her August 21, the culture was normal as it was on August 5. When I first examined her on August 5, the urine was very cloudy and colored because of her Pyridium. She looked like she had pan cystitis with erythema. Her upper tracts were cleared with a CT Scan. She has had 1 positive culture in the past and many negative. She smoked many years ago.   She saw [facility]. She was having suprapubic and vaginal pain. She had stopped her Premarin cream since she did not see improvement. She was also concerned about cardiac risk. She was given Uribel and a cream. Nadine wondered if she could have interstitial cystitis. Urine culture September 25 on that visit was normal.   Ms. Hiser still has suprapubic and vaginal pain but she said it is somewhat improved. She is on trimethoprim monotherapy. She said the cream that she applies to the introitus that [place] gave her helped some.   We both agree that we should perform a cystoscopy today.   After verbal consent and with sterile technique, Ms. Coglianese underwent flexible cystoscopy. Once again she had diffuse cystitis or erythema but it did appear to be improved. She had debris in the floor of her bladder and cloudy urine and white flecks. Clinically it looked infected. Her bladder was tender to filling.    Past Medical History Problems  1. History of  Arthritis V13.4 2. History of  Edema 782.3 3. History of  Esophageal Reflux 530.81 4. History of  Glaucoma 365.9 5. History of  Skin Cancer V10.83 6. History of  Varicose Veins 454.9  Surgical History Problems  1. History of  Appendectomy 2. History of  Ovarian Surgery 3. History of  Throat Surgery 4. History of  Tonsillectomy 5. History of  Tubal Ligation V25.2  Current Meds 1. Aspirin TABS;  Therapy: (Recorded:05Aug2014) to 2. B-12 TABS; Therapy: (Recorded:05Aug2014) to 3. Cranberry TABS; Therapy: (Recorded:05Aug2014) to 4. Furosemide TABS; Therapy: (Recorded:05Aug2014) to 5. Omeprazole 20 MG Oral Capsule Delayed Release; Therapy: (Recorded:05Aug2014) to 6. Potassium TABS; Therapy: (Recorded:05Aug2014) to 7. Sulfamethoxazole-TMP DS 800-160 MG Oral Tablet; take 1 tablet bid for 7 days; Therapy:  12Aug2014 to (Last Rx:12Aug2014) 8. Trimethoprim 100 MG Oral Tablet; 100 mg daily; Therapy: 12Aug2014 to (Last Rx:12Aug2014) 9. Uribel 118 MG Oral Capsule; TAKE 1 CAPSULE 4 times daily; Therapy: 25Sep2014 to (Last  Rx:25Sep2014)  Allergies Medication  1. Cipro TABS 2. Penicillins  Family History Problems  1. Family history of  Acute Myocardial Infarction V17.3 brother-age 36 2. Family history of  Death In The Family Father age 57-Drowning 3. Family history of  Death In The Family Mother concussion 4. Fraternal history of  Renal Failure  Social History Problems  1. Caffeine Use 2. Marital History - Widowed 3. Occupation: Retired 4. Tobacco Use 305.1 smoked 2 ppd for about 10 to 15 years , quit at age 17 Denied  5. History of  Alcohol Use  Vitals Vital Signs [Data Includes: Last 1 Day]  02Oct2014 11:55AM  Blood Pressure: 151 / 81 Temperature: 98.7 F Heart Rate: 109  Results/Data  Urine [Data Includes: Last 1 Day]   02Oct2014  COLOR GREEN   APPEARANCE CLOUDY   SPECIFIC GRAVITY 1.020   pH 6.5   GLUCOSE NEG mg/dL  BILIRUBIN SMALL   KETONE TRACE  mg/dL  BLOOD SMALL   PROTEIN 100 mg/dL  UROBILINOGEN 0.2 mg/dL  NITRITE NEG   LEUKOCYTE ESTERASE MOD   SQUAMOUS EPITHELIAL/HPF MANY   WBC TNTC WBC/hpf  RBC 3-6 RBC/hpf  BACTERIA FEW   CRYSTALS NONE SEEN   CASTS NONE SEEN    Assessment Assessed  1. Chronic Cystitis 595.2 2. Urinary Frequency 788.41 3. Vaginal Pain 625.8  Plan   Discussion/Summary   Ms. Schadt's bladder looks inflamed. She smoked  years ago. It does not look like carcinoma in situ. After several months dating back to August 5, she is still symptomatic though improved. She is not on any blood thinners.   Ms. Subramanian and I discussed the possibility of carcinoma in situ and I do not think she has this. She really looked like she has an infectious process. She is going to stay on trimethoprim. She will take Uribel up to twice a day. She can use the cream. I went over a bladder biopsy and rinsing her bladder. Pros, and cons and risks were discussed. Sequelae and bladder perforation discussed. We are going to proceed with this. Her symptoms are bothersome and hopefully we can get a better diagnostic answer.   Findings are not in keeping with interstitial cystitis.   After a thorough review of the management options for the patient's condition the patient  elected to proceed with surgical therapy as noted above. We have discussed the potential benefits and risks of the procedure, side effects of the proposed treatment, the likelihood of the patient achieving the goals of the procedure, and any potential problems that might occur during the procedure or recuperation. Informed consent has been obtained.

## 2013-02-24 ENCOUNTER — Encounter (HOSPITAL_BASED_OUTPATIENT_CLINIC_OR_DEPARTMENT_OTHER): Payer: Medicare Other | Admitting: Anesthesiology

## 2013-02-24 ENCOUNTER — Encounter (HOSPITAL_BASED_OUTPATIENT_CLINIC_OR_DEPARTMENT_OTHER)
Admission: RE | Disposition: A | Discharge status: Home / Self Care | Payer: Self-pay | Source: Ambulatory Visit | Attending: Urology

## 2013-02-24 ENCOUNTER — Ambulatory Visit (HOSPITAL_BASED_OUTPATIENT_CLINIC_OR_DEPARTMENT_OTHER)
Admission: RE | Admit: 2013-02-24 | Discharge: 2013-02-24 | Disposition: A | Discharge status: Home / Self Care | Payer: Medicare Other | Source: Ambulatory Visit | Attending: Urology | Admitting: Urology

## 2013-02-24 ENCOUNTER — Encounter (HOSPITAL_BASED_OUTPATIENT_CLINIC_OR_DEPARTMENT_OTHER): Payer: Self-pay | Admitting: *Deleted

## 2013-02-24 ENCOUNTER — Ambulatory Visit (HOSPITAL_BASED_OUTPATIENT_CLINIC_OR_DEPARTMENT_OTHER): Payer: Medicare Other | Admitting: Anesthesiology

## 2013-02-24 DIAGNOSIS — Z9089 Acquired absence of other organs: Secondary | ICD-10-CM | POA: Insufficient documentation

## 2013-02-24 DIAGNOSIS — N949 Unspecified condition associated with female genital organs and menstrual cycle: Secondary | ICD-10-CM | POA: Insufficient documentation

## 2013-02-24 DIAGNOSIS — Z85828 Personal history of other malignant neoplasm of skin: Secondary | ICD-10-CM | POA: Insufficient documentation

## 2013-02-24 DIAGNOSIS — N3020 Other chronic cystitis without hematuria: Secondary | ICD-10-CM | POA: Insufficient documentation

## 2013-02-24 DIAGNOSIS — K219 Gastro-esophageal reflux disease without esophagitis: Secondary | ICD-10-CM | POA: Insufficient documentation

## 2013-02-24 DIAGNOSIS — Z7982 Long term (current) use of aspirin: Secondary | ICD-10-CM | POA: Insufficient documentation

## 2013-02-24 DIAGNOSIS — H409 Unspecified glaucoma: Secondary | ICD-10-CM | POA: Insufficient documentation

## 2013-02-24 HISTORY — DX: Personal history of other diseases of the digestive system: Z87.19

## 2013-02-24 HISTORY — PX: CYSTOSCOPY WITH BIOPSY: SHX5122

## 2013-02-24 HISTORY — DX: Personal history of urinary calculi: Z87.442

## 2013-02-24 HISTORY — DX: Unspecified glaucoma: H40.9

## 2013-02-24 HISTORY — DX: Cystitis, unspecified without hematuria: N30.90

## 2013-02-24 HISTORY — DX: Occlusion and stenosis of right vertebral artery: I65.01

## 2013-02-24 HISTORY — DX: Other intervertebral disc degeneration, lumbar region: M51.36

## 2013-02-24 HISTORY — DX: Presence of spectacles and contact lenses: Z97.3

## 2013-02-24 HISTORY — DX: Barrett's esophagus without dysplasia: K22.70

## 2013-02-24 HISTORY — DX: Other complications of anesthesia, initial encounter: T88.59XA

## 2013-02-24 HISTORY — DX: Urgency of urination: R39.15

## 2013-02-24 HISTORY — DX: Localized edema: R60.0

## 2013-02-24 HISTORY — DX: Nocturia: R35.1

## 2013-02-24 HISTORY — DX: Other specified postprocedural states: Z98.890

## 2013-02-24 HISTORY — DX: Unilateral inguinal hernia, without obstruction or gangrene, not specified as recurrent: K40.90

## 2013-02-24 HISTORY — DX: Diverticulosis of intestine, part unspecified, without perforation or abscess without bleeding: K57.90

## 2013-02-24 HISTORY — DX: Headache: R51

## 2013-02-24 HISTORY — DX: Adverse effect of unspecified anesthetic, initial encounter: T41.45XA

## 2013-02-24 HISTORY — DX: Occlusion and stenosis of unspecified carotid artery: I65.29

## 2013-02-24 HISTORY — DX: Stricture of artery: I77.1

## 2013-02-24 LAB — POCT I-STAT, CHEM 8
HCT: 42 % (ref 36.0–46.0)
Hemoglobin: 14.3 g/dL (ref 12.0–15.0)
Potassium: 3.3 mEq/L — ABNORMAL LOW (ref 3.5–5.1)
Sodium: 139 mEq/L (ref 135–145)
TCO2: 27 mmol/L (ref 0–100)

## 2013-02-24 SURGERY — CYSTOSCOPY, WITH BIOPSY
Anesthesia: General | Site: Bladder | Wound class: Clean Contaminated

## 2013-02-24 MED ORDER — LACTATED RINGERS IV SOLN
INTRAVENOUS | Status: DC
  Filled 2013-02-24: qty 1000

## 2013-02-24 MED ORDER — PROPOFOL 10 MG/ML IV BOLUS
INTRAVENOUS | Status: DC | PRN
  Administered 2013-02-24: 160 mg via INTRAVENOUS

## 2013-02-24 MED ORDER — FENTANYL CITRATE 0.05 MG/ML IJ SOLN
25.0000 ug | INTRAMUSCULAR | Status: DC | PRN
  Filled 2013-02-24: qty 1

## 2013-02-24 MED ORDER — GENTAMICIN IN SALINE 1.6-0.9 MG/ML-% IV SOLN
80.0000 mg | INTRAVENOUS | Status: DC
  Filled 2013-02-24: qty 50

## 2013-02-24 MED ORDER — KETOROLAC TROMETHAMINE 30 MG/ML IJ SOLN
INTRAMUSCULAR | Status: DC | PRN
  Administered 2013-02-24: 30 mg via INTRAVENOUS

## 2013-02-24 MED ORDER — LIDOCAINE HCL (CARDIAC) 20 MG/ML IV SOLN
INTRAVENOUS | Status: DC | PRN
  Administered 2013-02-24: 60 mg via INTRAVENOUS

## 2013-02-24 MED ORDER — HYDROCODONE-ACETAMINOPHEN 5-325 MG PO TABS: 1.0000 | ORAL_TABLET | Freq: Four times a day (QID) | ORAL | Status: DC | PRN

## 2013-02-24 MED ORDER — DEXAMETHASONE SODIUM PHOSPHATE 4 MG/ML IJ SOLN
INTRAMUSCULAR | Status: DC | PRN
  Administered 2013-02-24: 8 mg via INTRAVENOUS

## 2013-02-24 MED ORDER — ONDANSETRON HCL 4 MG/2ML IJ SOLN
INTRAMUSCULAR | Status: DC | PRN
  Administered 2013-02-24: 4 mg via INTRAVENOUS

## 2013-02-24 MED ORDER — LACTATED RINGERS IV SOLN
INTRAVENOUS | Status: DC
  Administered 2013-02-24: 09:00:00 via INTRAVENOUS
  Filled 2013-02-24: qty 1000

## 2013-02-24 MED ORDER — FENTANYL CITRATE 0.05 MG/ML IJ SOLN
INTRAMUSCULAR | Status: DC | PRN
  Administered 2013-02-24 (×2): 25 ug via INTRAVENOUS
  Administered 2013-02-24: 50 ug via INTRAVENOUS

## 2013-02-24 MED ORDER — STERILE WATER FOR IRRIGATION IR SOLN
Status: DC | PRN
  Administered 2013-02-24: 3000 mL

## 2013-02-24 MED ORDER — GENTAMICIN SULFATE 40 MG/ML IJ SOLN
5.0000 mg/kg | INTRAVENOUS | Status: AC
  Administered 2013-02-24: 330 mg via INTRAVENOUS
  Filled 2013-02-24: qty 8.25

## 2013-02-24 SURGICAL SUPPLY — 19 items
BAG DRAIN URO-CYSTO SKYTR STRL (DRAIN) ×2 IMPLANT
BAG URINE DRAINAGE (UROLOGICAL SUPPLIES) ×2 IMPLANT
CANISTER SUCT LVC 12 LTR MEDI- (MISCELLANEOUS) ×2 IMPLANT
CATH COUDE FOLEY 2W 5CC 18FR (CATHETERS) ×2 IMPLANT
CATH ROBINSON RED A/P 12FR (CATHETERS) IMPLANT
CATH ROBINSON RED A/P 14FR (CATHETERS) IMPLANT
CLOTH BEACON ORANGE TIMEOUT ST (SAFETY) ×2 IMPLANT
DRAPE CAMERA CLOSED 9X96 (DRAPES) ×2 IMPLANT
ELECT REM PT RETURN 9FT ADLT (ELECTROSURGICAL) ×2
ELECTRODE REM PT RTRN 9FT ADLT (ELECTROSURGICAL) ×1 IMPLANT
GLOVE BIO SURGEON STRL SZ7.5 (GLOVE) ×2 IMPLANT
GLOVE BIOGEL PI IND STRL 7.5 (GLOVE) ×3 IMPLANT
GLOVE BIOGEL PI INDICATOR 7.5 (GLOVE) ×3
GOWN STRL REIN XL XLG (GOWN DISPOSABLE) ×4 IMPLANT
NDL SAFETY ECLIPSE 18X1.5 (NEEDLE) IMPLANT
NEEDLE HYPO 18GX1.5 SHARP (NEEDLE)
PACK CYSTOSCOPY (CUSTOM PROCEDURE TRAY) ×2 IMPLANT
SYR 20CC LL (SYRINGE) IMPLANT
WATER STERILE IRR 3000ML UROMA (IV SOLUTION) ×2 IMPLANT

## 2013-02-24 NOTE — Op Note (Signed)
Preoperative diagnosis: Chronic cystitis and vaginal pain Postoperative diagnosis: Chronic cystitis and vaginal pain Surgery: Cystoscopy, bladder biopsies and fulguration Surgeon: Dr. Lorin Picket Kais Monje  The patient has the above diagnoses and consented the above procedure. Preoperative gentamicin was given.  23 French cystoscope was utilized. On insertion of the scope the patient once again had turbid-colored urine and almost plus-colored urine. It was sent for culture. With my urination technique the bladder was gently irrigated not overdistended.  Once the urine was clear I cystoscoped the patient twice and she can cystitis. She a little bit of edema the trigone no was minimal. There is no evidence of a fistula. There is no evidence of carcinoma  With the bladder partially distended I took 3 mildly deep biopsies. I fulgurated all 3 with the appropriate settings. There was little to no bleeding. Foley catheter was inserted patient was taken to the recovery room.  Before fulguration biopsy sites were inspected and there is no perforation or fat visualized  Further care will await biopsies.

## 2013-02-24 NOTE — Transfer of Care (Signed)
Immediate Anesthesia Transfer of Care Note  Patient: Linda Donaldson  Procedure(s) Performed: Procedure(s) (LRB): CYSTOSCOPY FULGERATION AND BLADDER BIOPSY (N/A)  Patient Location: PACU  Anesthesia Type: General  Level of Consciousness: awake, oriented, sedated and patient cooperative  Airway & Oxygen Therapy: Patient Spontanous Breathing and Patient connected to face mask oxygen  Post-op Assessment: Report given to PACU RN and Post -op Vital signs reviewed and stable  Post vital signs: Reviewed and stable  Complications: No apparent anesthesia complications

## 2013-02-24 NOTE — Anesthesia Procedure Notes (Signed)
Procedure Name: LMA Insertion Date/Time: 02/24/2013 10:21 AM Performed by: Renella Cunas D Pre-anesthesia Checklist: Patient identified, Emergency Drugs available, Suction available and Patient being monitored Patient Re-evaluated:Patient Re-evaluated prior to inductionOxygen Delivery Method: Circle System Utilized Preoxygenation: Pre-oxygenation with 100% oxygen Intubation Type: IV induction Ventilation: Mask ventilation without difficulty LMA: LMA inserted LMA Size: 4.0 Number of attempts: 1 Airway Equipment and Method: bite block Placement Confirmation: positive ETCO2 Tube secured with: Tape Dental Injury: Teeth and Oropharynx as per pre-operative assessment

## 2013-02-24 NOTE — Anesthesia Preprocedure Evaluation (Addendum)
Anesthesia Evaluation  Patient identified by MRN, date of birth, ID band Patient awake    Reviewed: Allergy & Precautions, H&P , NPO status , Patient's Chart, lab work & pertinent test results  Airway Mallampati: II TM Distance: >3 FB Neck ROM: full    Dental no notable dental hx. (+) Teeth Intact and Dental Advisory Given   Pulmonary neg pulmonary ROS,  breath sounds clear to auscultation  Pulmonary exam normal       Cardiovascular Exercise Tolerance: Good + Peripheral Vascular Disease negative cardio ROS  Rhythm:regular Rate:Normal     Neuro/Psych Glaucoma.  Common carotid artery stenosis.  Occlusion right vertebral artery TIAnegative neurological ROS  negative psych ROS   GI/Hepatic negative GI ROS, Neg liver ROS, GERD-  Controlled and Medicated,Barrett's esophagus. Esophageal stricture   Endo/Other  negative endocrine ROS  Renal/GU negative Renal ROS  negative genitourinary   Musculoskeletal   Abdominal   Peds  Hematology negative hematology ROS (+)   Anesthesia Other Findings   Reproductive/Obstetrics negative OB ROS                          Anesthesia Physical Anesthesia Plan  ASA: III  Anesthesia Plan: General   Post-op Pain Management:    Induction: Intravenous  Airway Management Planned: LMA  Additional Equipment:   Intra-op Plan:   Post-operative Plan:   Informed Consent: I have reviewed the patients History and Physical, chart, labs and discussed the procedure including the risks, benefits and alternatives for the proposed anesthesia with the patient or authorized representative who has indicated his/her understanding and acceptance.   Dental Advisory Given  Plan Discussed with: CRNA and Surgeon  Anesthesia Plan Comments:         Anesthesia Quick Evaluation

## 2013-02-24 NOTE — Anesthesia Postprocedure Evaluation (Signed)
  Anesthesia Post-op Note  Patient: Linda Donaldson  Procedure(s) Performed: Procedure(s) (LRB): CYSTOSCOPY FULGERATION AND BLADDER BIOPSY (N/A)  Patient Location: PACU  Anesthesia Type: General  Level of Consciousness: awake and alert   Airway and Oxygen Therapy: Patient Spontanous Breathing  Post-op Pain: mild  Post-op Assessment: Post-op Vital signs reviewed, Patient's Cardiovascular Status Stable, Respiratory Function Stable, Patent Airway and No signs of Nausea or vomiting  Last Vitals:  Filed Vitals:   02/24/13 1115  BP: 161/75  Pulse: 73  Temp:   Resp: 17    Post-op Vital Signs: stable   Complications: No apparent anesthesia complications

## 2013-02-24 NOTE — Interval H&P Note (Signed)
History and Physical Interval Note:  02/24/2013 9:35 AM  Linda Donaldson  has presented today for surgery, with the diagnosis of CYSTITIS   The various methods of treatment have been discussed with the patient and family. After consideration of risks, benefits and other options for treatment, the patient has consented to  Procedure(s): CYSTOSCOPY FULGERATION AND BLADDER BIOPSY (N/A) as a surgical intervention .  The patient's history has been reviewed, patient examined, no change in status, stable for surgery.  I have reviewed the patient's chart and labs.  Questions were answered to the patient's satisfaction.     Maki Sweetser A

## 2013-02-25 LAB — URINE CULTURE: Colony Count: >=100000

## 2013-03-02 ENCOUNTER — Encounter (HOSPITAL_BASED_OUTPATIENT_CLINIC_OR_DEPARTMENT_OTHER): Payer: Self-pay | Admitting: Urology

## 2013-03-17 ENCOUNTER — Other Ambulatory Visit (HOSPITAL_COMMUNITY): Payer: Self-pay | Admitting: Family Medicine

## 2013-03-17 DIAGNOSIS — Z1231 Encounter for screening mammogram for malignant neoplasm of breast: Secondary | ICD-10-CM

## 2013-04-01 ENCOUNTER — Ambulatory Visit (HOSPITAL_COMMUNITY): Payer: Medicare Other

## 2013-04-20 ENCOUNTER — Ambulatory Visit (HOSPITAL_COMMUNITY): Payer: Medicare Other

## 2013-04-23 ENCOUNTER — Ambulatory Visit (HOSPITAL_COMMUNITY)
Admission: RE | Admit: 2013-04-23 | Discharge: 2013-04-23 | Disposition: A | Discharge status: Home / Self Care | Payer: Medicare Other | Source: Ambulatory Visit | Attending: Family Medicine | Admitting: Family Medicine

## 2013-04-23 DIAGNOSIS — Z1231 Encounter for screening mammogram for malignant neoplasm of breast: Secondary | ICD-10-CM

## 2013-05-05 ENCOUNTER — Other Ambulatory Visit: Payer: Self-pay | Admitting: Family Medicine

## 2013-05-05 DIAGNOSIS — R928 Other abnormal and inconclusive findings on diagnostic imaging of breast: Secondary | ICD-10-CM

## 2013-05-15 ENCOUNTER — Ambulatory Visit
Admission: RE | Admit: 2013-05-15 | Discharge: 2013-05-15 | Disposition: A | Discharge status: Home / Self Care | Payer: Medicare Other | Source: Ambulatory Visit | Attending: Family Medicine | Admitting: Family Medicine

## 2013-05-15 DIAGNOSIS — R928 Other abnormal and inconclusive findings on diagnostic imaging of breast: Secondary | ICD-10-CM

## 2014-03-30 ENCOUNTER — Other Ambulatory Visit (HOSPITAL_COMMUNITY): Payer: Self-pay | Admitting: Family Medicine

## 2014-03-30 DIAGNOSIS — Z1231 Encounter for screening mammogram for malignant neoplasm of breast: Secondary | ICD-10-CM

## 2014-04-26 ENCOUNTER — Ambulatory Visit (HOSPITAL_COMMUNITY)
Admission: RE | Admit: 2014-04-26 | Discharge: 2014-04-26 | Disposition: A | Discharge status: Home / Self Care | Payer: Medicare Other | Source: Ambulatory Visit | Attending: Family Medicine | Admitting: Family Medicine

## 2014-04-26 DIAGNOSIS — Z1231 Encounter for screening mammogram for malignant neoplasm of breast: Secondary | ICD-10-CM | POA: Diagnosis present

## 2015-04-07 ENCOUNTER — Other Ambulatory Visit: Payer: Self-pay

## 2015-04-07 DIAGNOSIS — Z1231 Encounter for screening mammogram for malignant neoplasm of breast: Secondary | ICD-10-CM

## 2015-04-28 ENCOUNTER — Ambulatory Visit
Admission: RE | Admit: 2015-04-28 | Discharge: 2015-04-28 | Disposition: A | Discharge status: Home / Self Care | Payer: Medicare Other | Source: Ambulatory Visit

## 2015-04-28 DIAGNOSIS — Z1231 Encounter for screening mammogram for malignant neoplasm of breast: Secondary | ICD-10-CM

## 2019-02-08 ENCOUNTER — Emergency Department (HOSPITAL_COMMUNITY): Payer: Medicare Other

## 2019-02-08 ENCOUNTER — Other Ambulatory Visit: Payer: Self-pay

## 2019-02-08 ENCOUNTER — Inpatient Hospital Stay (HOSPITAL_COMMUNITY)
Admission: EM | Admit: 2019-02-08 | Discharge: 2019-02-12 | DRG: 064 | Disposition: A | Discharge status: Skilled Nursing Facility | Payer: Medicare Other | Attending: Family Medicine | Admitting: Family Medicine

## 2019-02-08 ENCOUNTER — Encounter (HOSPITAL_COMMUNITY): Payer: Self-pay | Admitting: Emergency Medicine

## 2019-02-08 DIAGNOSIS — I6521 Occlusion and stenosis of right carotid artery: Secondary | ICD-10-CM | POA: Diagnosis present

## 2019-02-08 DIAGNOSIS — R112 Nausea with vomiting, unspecified: Secondary | ICD-10-CM | POA: Diagnosis not present

## 2019-02-08 DIAGNOSIS — I6381 Other cerebral infarction due to occlusion or stenosis of small artery: Secondary | ICD-10-CM | POA: Diagnosis not present

## 2019-02-08 DIAGNOSIS — R531 Weakness: Secondary | ICD-10-CM | POA: Diagnosis present

## 2019-02-08 DIAGNOSIS — R402142 Coma scale, eyes open, spontaneous, at arrival to emergency department: Secondary | ICD-10-CM | POA: Diagnosis present

## 2019-02-08 DIAGNOSIS — Z87442 Personal history of urinary calculi: Secondary | ICD-10-CM

## 2019-02-08 DIAGNOSIS — H409 Unspecified glaucoma: Secondary | ICD-10-CM | POA: Diagnosis present

## 2019-02-08 DIAGNOSIS — I6501 Occlusion and stenosis of right vertebral artery: Secondary | ICD-10-CM | POA: Diagnosis present

## 2019-02-08 DIAGNOSIS — Z881 Allergy status to other antibiotic agents status: Secondary | ICD-10-CM

## 2019-02-08 DIAGNOSIS — Z66 Do not resuscitate: Secondary | ICD-10-CM | POA: Diagnosis present

## 2019-02-08 DIAGNOSIS — G9349 Other encephalopathy: Secondary | ICD-10-CM | POA: Diagnosis present

## 2019-02-08 DIAGNOSIS — Z886 Allergy status to analgesic agent status: Secondary | ICD-10-CM

## 2019-02-08 DIAGNOSIS — R260 Ataxic gait: Secondary | ICD-10-CM | POA: Diagnosis present

## 2019-02-08 DIAGNOSIS — R778 Other specified abnormalities of plasma proteins: Secondary | ICD-10-CM

## 2019-02-08 DIAGNOSIS — I5020 Unspecified systolic (congestive) heart failure: Secondary | ICD-10-CM | POA: Diagnosis present

## 2019-02-08 DIAGNOSIS — R197 Diarrhea, unspecified: Secondary | ICD-10-CM

## 2019-02-08 DIAGNOSIS — K219 Gastro-esophageal reflux disease without esophagitis: Secondary | ICD-10-CM | POA: Diagnosis present

## 2019-02-08 DIAGNOSIS — I429 Cardiomyopathy, unspecified: Secondary | ICD-10-CM

## 2019-02-08 DIAGNOSIS — R2990 Unspecified symptoms and signs involving the nervous system: Secondary | ICD-10-CM | POA: Diagnosis present

## 2019-02-08 DIAGNOSIS — Z880 Allergy status to penicillin: Secondary | ICD-10-CM

## 2019-02-08 DIAGNOSIS — Z23 Encounter for immunization: Secondary | ICD-10-CM

## 2019-02-08 DIAGNOSIS — R600 Localized edema: Secondary | ICD-10-CM | POA: Diagnosis present

## 2019-02-08 DIAGNOSIS — M1990 Unspecified osteoarthritis, unspecified site: Secondary | ICD-10-CM | POA: Diagnosis present

## 2019-02-08 DIAGNOSIS — I255 Ischemic cardiomyopathy: Secondary | ICD-10-CM | POA: Diagnosis present

## 2019-02-08 DIAGNOSIS — R402252 Coma scale, best verbal response, oriented, at arrival to emergency department: Secondary | ICD-10-CM | POA: Diagnosis present

## 2019-02-08 DIAGNOSIS — R402362 Coma scale, best motor response, obeys commands, at arrival to emergency department: Secondary | ICD-10-CM | POA: Diagnosis present

## 2019-02-08 DIAGNOSIS — Z20828 Contact with and (suspected) exposure to other viral communicable diseases: Secondary | ICD-10-CM | POA: Diagnosis present

## 2019-02-08 DIAGNOSIS — R42 Dizziness and giddiness: Secondary | ICD-10-CM

## 2019-02-08 DIAGNOSIS — R29707 NIHSS score 7: Secondary | ICD-10-CM | POA: Diagnosis present

## 2019-02-08 DIAGNOSIS — N390 Urinary tract infection, site not specified: Secondary | ICD-10-CM | POA: Diagnosis present

## 2019-02-08 DIAGNOSIS — E785 Hyperlipidemia, unspecified: Secondary | ICD-10-CM | POA: Diagnosis present

## 2019-02-08 DIAGNOSIS — G8194 Hemiplegia, unspecified affecting left nondominant side: Secondary | ICD-10-CM | POA: Diagnosis present

## 2019-02-08 DIAGNOSIS — I214 Non-ST elevation (NSTEMI) myocardial infarction: Secondary | ICD-10-CM | POA: Diagnosis present

## 2019-02-08 DIAGNOSIS — Z79899 Other long term (current) drug therapy: Secondary | ICD-10-CM

## 2019-02-08 DIAGNOSIS — M5136 Other intervertebral disc degeneration, lumbar region: Secondary | ICD-10-CM | POA: Diagnosis present

## 2019-02-08 DIAGNOSIS — I639 Cerebral infarction, unspecified: Secondary | ICD-10-CM

## 2019-02-08 DIAGNOSIS — Z87891 Personal history of nicotine dependence: Secondary | ICD-10-CM

## 2019-02-08 LAB — CBC WITH DIFFERENTIAL/PLATELET
Abs Immature Granulocytes: 0.02 10*3/uL (ref 0.00–0.07)
Basophils Absolute: 0 10*3/uL (ref 0.0–0.1)
Basophils Relative: 0 %
Eosinophils Absolute: 0.2 10*3/uL (ref 0.0–0.5)
Eosinophils Relative: 2 %
HCT: 43 % (ref 36.0–46.0)
Hemoglobin: 13.8 g/dL (ref 12.0–15.0)
Immature Granulocytes: 0 %
Lymphocytes Relative: 22 %
Lymphs Abs: 1.9 10*3/uL (ref 0.7–4.0)
MCH: 30.8 pg (ref 26.0–34.0)
MCHC: 32.1 g/dL (ref 30.0–36.0)
MCV: 96 fL (ref 80.0–100.0)
Monocytes Absolute: 0.9 10*3/uL (ref 0.1–1.0)
Monocytes Relative: 10 %
Neutro Abs: 5.8 10*3/uL (ref 1.7–7.7)
Neutrophils Relative %: 66 %
Platelets: 213 10*3/uL (ref 150–400)
RBC: 4.48 MIL/uL (ref 3.87–5.11)
RDW: 12.8 % (ref 11.5–15.5)
WBC: 8.8 10*3/uL (ref 4.0–10.5)
nRBC: 0 % (ref 0.0–0.2)

## 2019-02-08 MED ORDER — SODIUM CHLORIDE 0.9 % IV BOLUS
500.0000 mL | Freq: Once | INTRAVENOUS | Status: AC
  Administered 2019-02-09: 500 mL via INTRAVENOUS

## 2019-02-08 NOTE — ED Provider Notes (Signed)
[facility] EMERGENCY DEPARTMENT Provider Note   CSN: [id number] Arrival date & time: 02/08/19  2327     History   Chief Complaint Chief Complaint  Patient presents with   Weakness    HPI Linda Donaldson is a 85 y.o. female.     Patient presents via EMS with generalized weakness and dizziness.  Patient states she has not been feeling quite right throughout the day today but has a difficult time describing this.  EMS reports they were called to her apartment around 6 PM after a fall. the patient declined transport at that time.  Patient states she was walking with her cane when she leaned against a TV cabinet and had a lower herself to the ground.  She did not hit her head.  She did not lose consciousness.  She does not know what caused her to fall.  She denies any preceding dizziness or lightheadedness.  EMS was called again today by her grandchild when she was feeling weak and would not want to stay by herself tonight.  She denies any pain.  She denies any fever or recent illness.  No difficulty breathing, cough, fever, pain with urination or blood in the urine.  No vomiting, diarrhea, chest pain or shortness of breath.  Patient alert and oriented x3.  She lives by herself in a senior apartment.  Past medical history includes glaucoma, GERD, carotid artery stenosis, occlusion of right vertebral artery.  The history is provided by the patient and the EMS personnel.    Past Medical History:  Diagnosis Date   Arthritis    Barrett esophagus    Brachiocephalic artery stenosis, right (HCC)    PER PT TOLD POSSIBLE PREVIOUS TIA DUE TO HX VISUAL DISTURBANCE   Common carotid artery stenosis    RIGHT   Complication of anesthesia    PONV   Cystitis    DDD (degenerative disc disease), lumbar    Diverticulosis    Fluid retention in legs    GERD (gastroesophageal reflux disease)    Glaucoma of both eyes    H/O hiatal hernia    Headache(784.0)    History of esophageal  dilatation    2007   History of kidney stones    Left inguinal hernia    Nocturia    Occlusion of right vertebral artery without cerebral infarction    NEUROLOGIST-  DR PENUMALLI ([facility])   Urgency of urination    Wears glasses     Patient Active Problem List   Diagnosis Date Noted   COUGH 03/08/2010   INGUINAL HERNIA 10/21/2008   ARTHRITIS 11/26/2007   DYSPHAGIA 11/26/2007   GERD 08/21/2007   ESOPHAGEAL STRICTURE 10/23/2005   HIATAL HERNIA 10/23/2005    Past Surgical History:  Procedure Laterality Date   APPENDECTOMY  1945   CATARACT EXTRACTION W/ INTRAOCULAR LENS  IMPLANT, BILATERAL  2001   CHOLECYSTECTOMY  1994   CYSTOSCOPY WITH BIOPSY N/A 02/24/2013   Procedure: CYSTOSCOPY FULGERATION AND BLADDER BIOPSY;  Surgeon: Martina Sinner, MD;  Location: [facility];  Service: Urology;  Laterality: N/A;   DIRECT LARYNGOSCOPY  07-30-2008   W/ REMOVAL RIGHT VALLECULAR CYST   REMOVAL EAR CANAL CYST  1968   RIGHT OVARIAN CYSTECTOMY  1948   TEMPORAL ARTERY BIOPSY / LIGATION Left 01-03-2009   TONSILLECTOMY  1945   TUBAL LIGATION       OB History   No obstetric history on file.      Home Medications  Prior to Admission medications   Medication Sig Start Date End Date Taking? Authorizing Provider  acetaminophen (TYLENOL) 325 MG tablet Take 650 mg by mouth every 6 (six) hours as needed for pain.    [provider]  alendronate (FOSAMAX) 70 MG tablet Take 70 mg by mouth every 7 (seven) days. Take with a full glass of water on an empty stomach.    [provider]  bimatoprost (LUMIGAN) 0.01 % SOLN Place 1 drop into both eyes at bedtime.     [provider]  Calcium Carb-Cholecalciferol (CALCIUM 600/VITAMIN D3) 600-800 MG-UNIT TABS Take 1 tablet by mouth daily.    [provider]  cyclobenzaprine (FLEXERIL) 10 MG tablet Take 10 mg by mouth at bedtime as needed for muscle spasms.    [provider]  fluticasone (FLONASE) 50 MCG/ACT nasal spray Place 2 sprays into the nose as needed.     [provider]  furosemide (LASIX) 40 MG tablet Take 40 mg by mouth every other day. ALTERNATES W/ ZAROXOLYN EVERY OTHER DAY    [provider]  HYDROcodone-acetaminophen (NORCO) 5-325 MG per tablet Take 1 tablet by mouth every 6 (six) hours as needed for pain. 02/24/13   Alfredo Martinez, MD  Meth-Hyo-M Salley Hews Phos-Ph Sal (URIBEL) 118 MG CAPS Take 1 capsule by mouth 4 (four) times daily as needed.    [provider]  metolazone (ZAROXOLYN) 5 MG tablet Take 15 mg by mouth every other day. ALTERNATES WITH LASIX EVERY OTHER DATE    [provider]  omeprazole (PRILOSEC) 20 MG capsule Take 40 mg by mouth 2 (two) times daily.     [provider]  potassium chloride (K-DUR) 10 MEQ tablet Take 10-20 mEq by mouth 2 (two) times daily.     [provider]  traMADol (ULTRAM) 50 MG tablet Take 50 mg by mouth every 6 (six) hours as needed for pain.    [provider]  triamcinolone cream (KENALOG) 0.1 % Apply 1 application topically as needed.    [provider]  trolamine salicylate (ASPERCREME) 10 % cream Apply 1 application topically as needed.     [provider]    Family History Family History  Problem Relation Age of Onset   Cancer Mother     Social History Social History   Tobacco Use   Smoking status: Former Smoker    Years: 15.00    Types: Cigarettes    Quit date: 02/20/1952    Years since quitting: 67.0   Smokeless tobacco: Never Used  Substance Use Topics   Alcohol use: No   Drug use: No     Allergies   Aspirin, Penicillins, and Ciprofloxacin   Review of Systems Review of Systems  Constitutional: Positive for activity change. Negative for fatigue and fever.  HENT: Negative for congestion and rhinorrhea.   Respiratory: Negative for cough, chest tightness and shortness of breath.    Gastrointestinal: Negative for abdominal pain, nausea and vomiting.  Genitourinary: Negative for dysuria and hematuria.  Musculoskeletal: Negative for arthralgias and myalgias.  Skin: Negative for wound.  Neurological: Positive for dizziness, weakness and light-headedness. Negative for headaches.   all other systems are negative except as noted in the HPI and PMH.     Physical Exam Updated Vital Signs BP (!) 146/89 (BP Location: Left Arm)    Pulse 91    Temp 97.7 F (36.5 C) (Oral)    Resp 17    Ht 5' 3.5" (1.613 m)  Wt 78.5 kg    SpO2 98%    BMI 30.16 kg/m   Physical Exam Vitals signs and nursing note reviewed.  Constitutional:      General: She is not in acute distress.    Appearance: She is well-developed.     Comments: No distress, alert and oriented x3  HENT:     Head: Normocephalic and atraumatic.     Mouth/Throat:     Mouth: Mucous membranes are moist.     Pharynx: No oropharyngeal exudate.  Eyes:     Conjunctiva/sclera: Conjunctivae normal.     Pupils: Pupils are equal, round, and reactive to light.  Neck:     Musculoskeletal: Normal range of motion and neck supple.     Comments: No meningismus. Cardiovascular:     Rate and Rhythm: Normal rate and regular rhythm.     Heart sounds: Normal heart sounds. No murmur.  Pulmonary:     Effort: Pulmonary effort is normal. No respiratory distress.     Breath sounds: Normal breath sounds.  Abdominal:     Palpations: Abdomen is soft.     Tenderness: There is no abdominal tenderness. There is no guarding or rebound.  Musculoskeletal: Normal range of motion.        General: No tenderness.  Skin:    General: Skin is warm.     Capillary Refill: Capillary refill takes less than 2 seconds.  Neurological:     General: No focal deficit present.     Mental Status: She is alert and oriented to person, place, and time.     Cranial Nerves: No cranial nerve deficit.     Motor: No abnormal muscle tone.     Coordination:  Coordination normal.     Comments: CN 2-12 intact, no ataxia on finger to nose, no nystagmus, 5/5 strength throughout, no pronator drift, no appreciable nystagmus. Head impulse testing negative.   Psychiatric:        Behavior: Behavior normal.      ED Treatments / Results  Labs (all labs ordered are listed, but only abnormal results are displayed) Labs Reviewed  COMPREHENSIVE METABOLIC PANEL - Abnormal; Notable for the following components:      Result Value   Glucose, Bld 137 (*)    GFR calc non Af Amer 51 (*)    GFR calc Af Amer 59 (*)    All other components within normal limits  BRAIN NATRIURETIC PEPTIDE - Abnormal; Notable for the following components:   B Natriuretic Peptide 176.0 (*)    All other components within normal limits  TROPONIN I (HIGH SENSITIVITY) - Abnormal; Notable for the following components:   Troponin I (High Sensitivity) 2,559 (*)    All other components within normal limits  TROPONIN I (HIGH SENSITIVITY) - Abnormal; Notable for the following components:   Troponin I (High Sensitivity) 2,547 (*)    All other components within normal limits  CULTURE, BLOOD (ROUTINE X 2)  CULTURE, BLOOD (ROUTINE X 2)  SARS CORONAVIRUS 2 (TAT 6-24 HRS)  MRSA PCR SCREENING  CBC WITH DIFFERENTIAL/PLATELET  LACTIC ACID, PLASMA  LACTIC ACID, PLASMA  URINALYSIS, ROUTINE W REFLEX MICROSCOPIC  HEMOGLOBIN A1C    EKG EKG Interpretation  Date/Time:  Sunday February 08 2019 23:34:21 EDT Ventricular Rate:  89 PR Interval:    QRS Duration: 112 QT Interval:  358 QTC Calculation: 436 R Axis:   121 Text Interpretation:  Sinus rhythm Multiple ventricular premature complexes Borderline intraventricular conduction delay Low voltage, extremity leads Minimal  ST depression, inferior leads Nonspecific ST abnormality Confirmed by Ezequiel Essex [phone]) on 02/08/2019 11:38:57 PM   Radiology Ct Angio Head W Or Wo Contrast  Result Date: 02/09/2019 CLINICAL DATA:  dizziness and  weakness EXAM: CT ANGIOGRAPHY HEAD AND NECK TECHNIQUE: Multidetector CT imaging of the head and neck was performed using the standard protocol during bolus administration of intravenous contrast. Multiplanar CT image reconstructions and MIPs were obtained to evaluate the vascular anatomy. Carotid stenosis measurements (when applicable) are obtained utilizing NASCET criteria, using the distal internal carotid diameter as the denominator. CONTRAST:  137mL OMNIPAQUE IOHEXOL 350 MG/ML SOLN COMPARISON:  MRA 10/02/2012. FINDINGS: CTA NECK FINDINGS SKELETON: There is no bony spinal canal stenosis. No lytic or blastic lesion. OTHER NECK: Normal pharynx, larynx and major salivary glands. No cervical lymphadenopathy. Unremarkable thyroid gland. UPPER CHEST: No pneumothorax or pleural effusion. No nodules or masses. AORTIC ARCH: There is mild calcific atherosclerosis of the aortic arch. There is no aneurysm, dissection or hemodynamically significant stenosis of the visualized portion of the aorta. Conventional 3 vessel aortic branching pattern. The visualized proximal subclavian arteries are widely patent. RIGHT CAROTID SYSTEM: No dissection, occlusion or aneurysm. Mild atherosclerotic calcification at the carotid bifurcation without hemodynamically significant stenosis. LEFT CAROTID SYSTEM: No dissection, occlusion or aneurysm. Mild atherosclerotic calcification at the carotid bifurcation without hemodynamically significant stenosis. VERTEBRAL ARTERIES: Left dominant configuration. Both origins are clearly patent. The right vertebral artery V3 segment is occluded, as it was on 10/02/2012. The left vertebral artery is normal. CTA HEAD FINDINGS POSTERIOR CIRCULATION: --Vertebral arteries: Both V4 segments are opacified. --Posterior inferior cerebellar arteries (PICA): Patent origins from the vertebral arteries. --Anterior inferior cerebellar arteries (AICA): Patent origins from the basilar artery. --Basilar artery: Normal.  --Superior cerebellar arteries: Normal. --Posterior cerebral arteries: Normal. Both originate from the basilar artery. Posterior communicating arteries (p-comm) are diminutive or absent. ANTERIOR CIRCULATION: --Intracranial internal carotid arteries: Atherosclerotic calcification of the internal carotid arteries at the skull base without hemodynamically significant stenosis. --Anterior cerebral arteries (ACA): Normal. Both A1 segments are present. Patent anterior communicating artery (a-comm). --Middle cerebral arteries (MCA): Normal. VENOUS SINUSES: As permitted by contrast timing, patent. ANATOMIC VARIANTS: None Review of the MIP images confirms the above findings. IMPRESSION: 1. No intracranial arterial occlusion or high-grade stenosis. 2. Occlusion of the right vertebral artery V3 segment, which was occluded on the MRA of 10/02/2012 3. Aortic Atherosclerosis (ICD10-I70.0). Electronically Signed   By: Ulyses Jarred M.D.   On: 02/09/2019 02:50   Dg Chest 2 View  Result Date: 02/09/2019 CLINICAL DATA:  Weakness EXAM: CHEST - 2 VIEW COMPARISON:  05/27/2008 FINDINGS: No pleural effusion. Diffuse coarse interstitial opacity with mild reticulation suggesting component of chronic interstitial lung disease. Streaky opacities in the upper lobes and left base. Normal cardiomediastinal silhouette. No pneumothorax. IMPRESSION: 1. Diffuse coarse and mildly reticular interstitial opacity, suspect for component of chronic interstitial lung disease. Streaky opacities are present within the right greater than left upper lobe and the left lung base and may reflect acute superimposed pneumonia. Electronically Signed   By: Donavan Foil M.D.   On: 02/09/2019 01:17   Ct Head Wo Contrast  Result Date: 02/09/2019 CLINICAL DATA:  Vertigo, episodic EXAM: CT HEAD WITHOUT CONTRAST TECHNIQUE: Contiguous axial images were obtained from the base of the skull through the vertex without intravenous contrast. COMPARISON:  None. FINDINGS:  Brain: No evidence of acute territorial infarction, hemorrhage, hydrocephalus,extra-axial collection or mass lesion/mass effect. There is dilatation the ventricles and sulci consistent with age-related atrophy.  Low-attenuation changes in the deep white matter consistent with small vessel ischemia. Vascular: No hyperdense vessel or unexpected calcification. Skull: The skull is intact. No fracture or focal lesion identified. Sinuses/Orbits: The visualized paranasal sinuses and mastoid air cells are clear. The orbits and globes intact. Other: None IMPRESSION: No acute intracranial abnormality. Findings consistent with age related atrophy and chronic small vessel ischemia Electronically Signed   By: Jonna Clark M.D.   On: 02/09/2019 01:03   Ct Angio Neck W And/or Wo Contrast  Result Date: 02/09/2019 CLINICAL DATA:  dizziness and weakness EXAM: CT ANGIOGRAPHY HEAD AND NECK TECHNIQUE: Multidetector CT imaging of the head and neck was performed using the standard protocol during bolus administration of intravenous contrast. Multiplanar CT image reconstructions and MIPs were obtained to evaluate the vascular anatomy. Carotid stenosis measurements (when applicable) are obtained utilizing NASCET criteria, using the distal internal carotid diameter as the denominator. CONTRAST:  OMNIPAQUE IOHEXOL 350 MG/ML SOLN COMPARISON:  MRA 10/02/2012. FINDINGS: CTA NECK FINDINGS SKELETON: There is no bony spinal canal stenosis. No lytic or blastic lesion. OTHER NECK: Normal pharynx, larynx and major salivary glands. No cervical lymphadenopathy. Unremarkable thyroid gland. UPPER CHEST: No pneumothorax or pleural effusion. No nodules or masses. AORTIC ARCH: There is mild calcific atherosclerosis of the aortic arch. There is no aneurysm, dissection or hemodynamically significant stenosis of the visualized portion of the aorta. Conventional 3 vessel aortic branching pattern. The visualized proximal subclavian arteries are widely  patent. RIGHT CAROTID SYSTEM: No dissection, occlusion or aneurysm. Mild atherosclerotic calcification at the carotid bifurcation without hemodynamically significant stenosis. LEFT CAROTID SYSTEM: No dissection, occlusion or aneurysm. Mild atherosclerotic calcification at the carotid bifurcation without hemodynamically significant stenosis. VERTEBRAL ARTERIES: Left dominant configuration. Both origins are clearly patent. The right vertebral artery V3 segment is occluded, as it was on 10/02/2012. The left vertebral artery is normal. CTA HEAD FINDINGS POSTERIOR CIRCULATION: --Vertebral arteries: Both V4 segments are opacified. --Posterior inferior cerebellar arteries (PICA): Patent origins from the vertebral arteries. --Anterior inferior cerebellar arteries (AICA): Patent origins from the basilar artery. --Basilar artery: Normal. --Superior cerebellar arteries: Normal. --Posterior cerebral arteries: Normal. Both originate from the basilar artery. Posterior communicating arteries (p-comm) are diminutive or absent. ANTERIOR CIRCULATION: --Intracranial internal carotid arteries: Atherosclerotic calcification of the internal carotid arteries at the skull base without hemodynamically significant stenosis. --Anterior cerebral arteries (ACA): Normal. Both A1 segments are present. Patent anterior communicating artery (a-comm). --Middle cerebral arteries (MCA): Normal. VENOUS SINUSES: As permitted by contrast timing, patent. ANATOMIC VARIANTS: None Review of the MIP images confirms the above findings. IMPRESSION: 1. No intracranial arterial occlusion or high-grade stenosis. 2. Occlusion of the right vertebral artery V3 segment, which was occluded on the MRA of 10/02/2012 3. Aortic Atherosclerosis (ICD10-I70.0). Electronically Signed   By: Deatra Robinson M.D.   On: 02/09/2019 02:50    Procedures Procedures (including critical care time)  Medications Ordered in ED Medications - No data to display   Initial Impression /  Assessment and Plan / ED Course  I have reviewed the triage vital signs and the nursing notes.  Pertinent labs & imaging results that were available during my care of the patient were reviewed by me and considered in my medical decision making (see chart for details).       Patient from home with poorly described sensation of not feeling right with generalized weakness and a fall earlier today.  Denies any vertigo or room spinning dizziness.  Nonfocal neurological exam.  EKG is sinus rhythm  with PVCs.  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 Her neurologist Dr. Marjory LiesPenumalli favored to treat this medically with ASA.  Patient mildly orthostatic.  Blood pressure lying is 148 systolic versus 121 with standing.  Mild increase in heart rate from 93 to 100. IVF given.  Troponin is surprisingly elevated at 2559.  Repeat EKG shows nonspecific ST depressions inferiorly but no ST elevation.  Patient states no chest pain currently or in the past several days.  She reports aspirin allergy so this was not given. plavix dose was given.  Discussed with Dr. Daphine DeutscherMartin of cardiology at [facility].  She agrees history is atypical for NSTEMI and other differentials need to be considered.  CVA is possible with patient's vague sensation of dizziness.  CT head is pending. Troponin seems higher than expected in setting of CVA. Dr. Daphine DeutscherMartin feels patient should be admitted to the hospitalist service if she is transferred.  Agrees with involving neurology to weigh in on the safety of heparin. Patient could potentially be seen by cardiology at [facility] as well.   Spoke with neurology Dr. Lyndee LeoBehravan  .  He agrees there is some concern for posterior circulation infarct and does not recommend starting heparin until MRI can be done. Does not feel CTA is absolutely necessary because she is not a candidate for endovascular intervention and is LVO negative.  Per Dr.  Lyndee LeoBehravan: "Symptoms/presentation is concerning for posterior circulation stroke. Cannot rule out neurological deficits due to systemic condition such as MI. Last time known well>24 hours therefore not a candidate for Thrombolysis or Endovascular treatment."  Patient continues to deny chest pain.  Repeat troponin is essentially unchanged. Heparin held per neurology recommendations until MRI can be done to rule out acute infarct.  Patient remained stable and resting comfortably.  Denies pain.  After discussion with hospitalist she decided that she wants to be DNR. [facility].  Admission discussed with Dr. Lucianne MussKumar.  CRITICAL CARE Performed by: Glynn OctaveStephen Fathima Bartl Total critical care time: 50 minutes Critical care time was exclusive of separately billable procedures and treating other patients. Critical care was necessary to treat or prevent imminent or life-threatening deterioration. Critical care was time spent personally by me on the following activities: development of treatment plan with patient and/or surrogate as well as nursing, discussions with consultants, evaluation of patient's response to treatment, examination of patient, obtaining history from patient or surrogate, ordering and performing treatments and interventions, ordering and review of laboratory studies, ordering and review of radiographic studies, pulse oximetry and re-evaluation of patient's condition.  Final Clinical Impressions(s) / ED Diagnoses   Final diagnoses:  Dizziness  Elevated troponin    ED Discharge Orders    None       Ceclia Koker, Jeannett SeniorStephen, MD 02/09/19 [phone]

## 2019-02-08 NOTE — ED Triage Notes (Signed)
Per [facility] they were called out for fall around 1900 but refused to come to ER, called back for weakness and dizziness, v/s stable, per pt she has been feeling weak today, worsening tonight

## 2019-02-09 ENCOUNTER — Inpatient Hospital Stay (HOSPITAL_COMMUNITY): Payer: Medicare Other

## 2019-02-09 ENCOUNTER — Encounter (HOSPITAL_COMMUNITY): Payer: Self-pay

## 2019-02-09 ENCOUNTER — Emergency Department (HOSPITAL_COMMUNITY): Payer: Medicare Other

## 2019-02-09 DIAGNOSIS — R29707 NIHSS score 7: Secondary | ICD-10-CM | POA: Diagnosis present

## 2019-02-09 DIAGNOSIS — R42 Dizziness and giddiness: Secondary | ICD-10-CM | POA: Diagnosis present

## 2019-02-09 DIAGNOSIS — I361 Nonrheumatic tricuspid (valve) insufficiency: Secondary | ICD-10-CM

## 2019-02-09 DIAGNOSIS — R402362 Coma scale, best motor response, obeys commands, at arrival to emergency department: Secondary | ICD-10-CM | POA: Diagnosis present

## 2019-02-09 DIAGNOSIS — E785 Hyperlipidemia, unspecified: Secondary | ICD-10-CM | POA: Diagnosis present

## 2019-02-09 DIAGNOSIS — R260 Ataxic gait: Secondary | ICD-10-CM | POA: Diagnosis present

## 2019-02-09 DIAGNOSIS — I255 Ischemic cardiomyopathy: Secondary | ICD-10-CM | POA: Diagnosis present

## 2019-02-09 DIAGNOSIS — R402252 Coma scale, best verbal response, oriented, at arrival to emergency department: Secondary | ICD-10-CM | POA: Diagnosis present

## 2019-02-09 DIAGNOSIS — R112 Nausea with vomiting, unspecified: Secondary | ICD-10-CM | POA: Diagnosis not present

## 2019-02-09 DIAGNOSIS — I6381 Other cerebral infarction due to occlusion or stenosis of small artery: Secondary | ICD-10-CM | POA: Diagnosis present

## 2019-02-09 DIAGNOSIS — R197 Diarrhea, unspecified: Secondary | ICD-10-CM | POA: Diagnosis present

## 2019-02-09 DIAGNOSIS — G8194 Hemiplegia, unspecified affecting left nondominant side: Secondary | ICD-10-CM | POA: Diagnosis present

## 2019-02-09 DIAGNOSIS — I639 Cerebral infarction, unspecified: Secondary | ICD-10-CM | POA: Diagnosis not present

## 2019-02-09 DIAGNOSIS — Z66 Do not resuscitate: Secondary | ICD-10-CM | POA: Diagnosis present

## 2019-02-09 DIAGNOSIS — M1990 Unspecified osteoarthritis, unspecified site: Secondary | ICD-10-CM | POA: Diagnosis present

## 2019-02-09 DIAGNOSIS — Z20828 Contact with and (suspected) exposure to other viral communicable diseases: Secondary | ICD-10-CM | POA: Diagnosis present

## 2019-02-09 DIAGNOSIS — I429 Cardiomyopathy, unspecified: Secondary | ICD-10-CM | POA: Diagnosis not present

## 2019-02-09 DIAGNOSIS — I6521 Occlusion and stenosis of right carotid artery: Secondary | ICD-10-CM | POA: Diagnosis present

## 2019-02-09 DIAGNOSIS — I5020 Unspecified systolic (congestive) heart failure: Secondary | ICD-10-CM | POA: Diagnosis present

## 2019-02-09 DIAGNOSIS — N390 Urinary tract infection, site not specified: Secondary | ICD-10-CM | POA: Diagnosis present

## 2019-02-09 DIAGNOSIS — M5136 Other intervertebral disc degeneration, lumbar region: Secondary | ICD-10-CM | POA: Diagnosis present

## 2019-02-09 DIAGNOSIS — R2990 Unspecified symptoms and signs involving the nervous system: Secondary | ICD-10-CM | POA: Diagnosis not present

## 2019-02-09 DIAGNOSIS — K219 Gastro-esophageal reflux disease without esophagitis: Secondary | ICD-10-CM | POA: Diagnosis present

## 2019-02-09 DIAGNOSIS — R402142 Coma scale, eyes open, spontaneous, at arrival to emergency department: Secondary | ICD-10-CM | POA: Diagnosis present

## 2019-02-09 DIAGNOSIS — R600 Localized edema: Secondary | ICD-10-CM | POA: Diagnosis present

## 2019-02-09 DIAGNOSIS — I214 Non-ST elevation (NSTEMI) myocardial infarction: Secondary | ICD-10-CM | POA: Diagnosis present

## 2019-02-09 DIAGNOSIS — Z23 Encounter for immunization: Secondary | ICD-10-CM | POA: Diagnosis present

## 2019-02-09 DIAGNOSIS — I6501 Occlusion and stenosis of right vertebral artery: Secondary | ICD-10-CM | POA: Diagnosis present

## 2019-02-09 DIAGNOSIS — R531 Weakness: Secondary | ICD-10-CM | POA: Diagnosis present

## 2019-02-09 DIAGNOSIS — G9349 Other encephalopathy: Secondary | ICD-10-CM | POA: Diagnosis present

## 2019-02-09 LAB — COMPREHENSIVE METABOLIC PANEL
ALT: 9 U/L (ref 0–44)
AST: 22 U/L (ref 15–41)
Albumin: 3.8 g/dL (ref 3.5–5.0)
Alkaline Phosphatase: 49 U/L (ref 38–126)
Anion gap: 9 (ref 5–15)
BUN: 21 mg/dL (ref 8–23)
CO2: 22 mmol/L (ref 22–32)
Calcium: 9.9 mg/dL (ref 8.9–10.3)
Chloride: 105 mmol/L (ref 98–111)
Creatinine, Ser: 0.96 mg/dL (ref 0.44–1.00)
GFR calc Af Amer: 59 mL/min — ABNORMAL LOW (ref >60)
GFR calc non Af Amer: 51 mL/min — ABNORMAL LOW (ref >60)
Glucose, Bld: 137 mg/dL — ABNORMAL HIGH (ref 70–99)
Potassium: 3.9 mmol/L (ref 3.5–5.1)
Sodium: 136 mmol/L (ref 135–145)
Total Bilirubin: 0.4 mg/dL (ref 0.3–1.2)
Total Protein: 7.2 g/dL (ref 6.5–8.1)

## 2019-02-09 LAB — ECHOCARDIOGRAM COMPLETE
Height: 63 in
Weight: 2857.16 oz

## 2019-02-09 LAB — TSH: TSH: 2.043 u[IU]/mL (ref 0.350–4.500)

## 2019-02-09 LAB — LIPID PANEL
Cholesterol: 151 mg/dL (ref 0–200)
HDL: 54 mg/dL (ref >40)
LDL Cholesterol: 74 mg/dL (ref 0–99)
Total CHOL/HDL Ratio: 2.8 RATIO
Triglycerides: 116 mg/dL (ref <150)
VLDL: 23 mg/dL (ref 0–40)

## 2019-02-09 LAB — SARS CORONAVIRUS 2 (TAT 6-24 HRS): SARS Coronavirus 2: NEGATIVE

## 2019-02-09 LAB — HEMOGLOBIN A1C
Hgb A1c MFr Bld: 5.5 % (ref 4.8–5.6)
Mean Plasma Glucose: 111.15 mg/dL

## 2019-02-09 LAB — MRSA PCR SCREENING: MRSA by PCR: NEGATIVE

## 2019-02-09 LAB — C DIFFICILE QUICK SCREEN W PCR REFLEX
C Diff antigen: NEGATIVE
C Diff interpretation: NOT DETECTED
C Diff toxin: NEGATIVE

## 2019-02-09 LAB — BRAIN NATRIURETIC PEPTIDE: B Natriuretic Peptide: 176 pg/mL — ABNORMAL HIGH (ref 0.0–100.0)

## 2019-02-09 LAB — TROPONIN I (HIGH SENSITIVITY)
Troponin I (High Sensitivity): 2547 ng/L (ref <18)
Troponin I (High Sensitivity): 2559 ng/L (ref <18)

## 2019-02-09 LAB — LACTIC ACID, PLASMA
Lactic Acid, Venous: 1.2 mmol/L (ref 0.5–1.9)
Lactic Acid, Venous: 1.4 mmol/L (ref 0.5–1.9)

## 2019-02-09 MED ORDER — ONDANSETRON HCL 4 MG/2ML IJ SOLN
4.0000 mg | Freq: Once | INTRAMUSCULAR | Status: AC
  Administered 2019-02-09: 18:00:00 4 mg via INTRAVENOUS

## 2019-02-09 MED ORDER — ONDANSETRON HCL 4 MG/2ML IJ SOLN
4.0000 mg | Freq: Once | INTRAMUSCULAR | Status: AC
  Administered 2019-02-09: 05:00:00 4 mg via INTRAVENOUS

## 2019-02-09 MED ORDER — ATORVASTATIN CALCIUM 40 MG PO TABS
80.0000 mg | ORAL_TABLET | Freq: Every day | ORAL | Status: DC
  Administered 2019-02-09 – 2019-02-11 (×3): 80 mg via ORAL
  Filled 2019-02-09 (×3): qty 2

## 2019-02-09 MED ORDER — METOLAZONE 5 MG PO TABS: 15.0000 mg | ORAL_TABLET | ORAL | Status: DC

## 2019-02-09 MED ORDER — CHLORHEXIDINE GLUCONATE CLOTH 2 % EX PADS
6.0000 | MEDICATED_PAD | Freq: Every day | CUTANEOUS | Status: DC
  Administered 2019-02-09 – 2019-02-12 (×3): 6 via TOPICAL

## 2019-02-09 MED ORDER — CLOPIDOGREL BISULFATE 75 MG PO TABS
75.0000 mg | ORAL_TABLET | Freq: Once | ORAL | Status: AC
  Administered 2019-02-09: 75 mg via ORAL
  Filled 2019-02-09: qty 1

## 2019-02-09 MED ORDER — POTASSIUM CHLORIDE CRYS ER 10 MEQ PO TBCR
10.0000 meq | EXTENDED_RELEASE_TABLET | Freq: Two times a day (BID) | ORAL | Status: DC
  Administered 2019-02-09: 10:00:00 10 meq via ORAL
  Filled 2019-02-09: qty 1

## 2019-02-09 MED ORDER — CLOPIDOGREL BISULFATE 75 MG PO TABS
75.0000 mg | ORAL_TABLET | Freq: Every day | ORAL | Status: DC
  Administered 2019-02-09 – 2019-02-12 (×4): 75 mg via ORAL
  Filled 2019-02-09 (×4): qty 1

## 2019-02-09 MED ORDER — ONDANSETRON HCL 4 MG/2ML IJ SOLN
4.0000 mg | Freq: Once | INTRAMUSCULAR | Status: AC
  Administered 2019-02-09: 02:00:00 4 mg via INTRAVENOUS

## 2019-02-09 MED ORDER — PRAVASTATIN SODIUM 40 MG PO TABS
40.0000 mg | ORAL_TABLET | Freq: Every day | ORAL | Status: DC
  Filled 2019-02-09: qty 1

## 2019-02-09 MED ORDER — METOPROLOL TARTRATE 25 MG PO TABS
12.5000 mg | ORAL_TABLET | Freq: Two times a day (BID) | ORAL | Status: DC
  Administered 2019-02-09: 22:00:00 12.5 mg via ORAL
  Filled 2019-02-09: qty 1

## 2019-02-09 MED ORDER — ONDANSETRON HCL 4 MG/2ML IJ SOLN
INTRAMUSCULAR | Status: AC
  Filled 2019-02-09: qty 2

## 2019-02-09 MED ORDER — TRIMETHOPRIM 100 MG PO TABS
100.0000 mg | ORAL_TABLET | Freq: Every day | ORAL | Status: DC
  Administered 2019-02-09 – 2019-02-12 (×4): 100 mg via ORAL
  Filled 2019-02-09 (×5): qty 1

## 2019-02-09 MED ORDER — PANTOPRAZOLE SODIUM 40 MG PO TBEC
40.0000 mg | DELAYED_RELEASE_TABLET | Freq: Every day | ORAL | Status: DC
  Administered 2019-02-09 – 2019-02-12 (×4): 40 mg via ORAL
  Filled 2019-02-09 (×4): qty 1

## 2019-02-09 MED ORDER — POTASSIUM CHLORIDE CRYS ER 10 MEQ PO TBCR
10.0000 meq | EXTENDED_RELEASE_TABLET | Freq: Two times a day (BID) | ORAL | Status: DC
  Administered 2019-02-09 – 2019-02-12 (×6): 10 meq via ORAL
  Filled 2019-02-09 (×6): qty 1

## 2019-02-09 MED ORDER — LATANOPROST 0.005 % OP SOLN
1.0000 [drp] | Freq: Every day | OPHTHALMIC | Status: DC
  Administered 2019-02-09 – 2019-02-12 (×3): 1 [drp] via OPHTHALMIC
  Filled 2019-02-09 (×3): qty 2.5

## 2019-02-09 MED ORDER — IOHEXOL 350 MG/ML SOLN
100.0000 mL | Freq: Once | INTRAVENOUS | Status: AC | PRN
  Administered 2019-02-09: 02:00:00 100 mL via INTRAVENOUS

## 2019-02-09 MED ORDER — ONDANSETRON HCL 4 MG/2ML IJ SOLN
4.0000 mg | Freq: Four times a day (QID) | INTRAMUSCULAR | Status: DC | PRN
  Administered 2019-02-09 – 2019-02-10 (×2): 4 mg via INTRAVENOUS
  Filled 2019-02-09 (×3): qty 2

## 2019-02-09 MED ORDER — PERFLUTREN LIPID MICROSPHERE
1.0000 mL | INTRAVENOUS | Status: AC | PRN
  Administered 2019-02-09: 17:00:00 1 mL via INTRAVENOUS
  Administered 2019-02-09: 2 mL via INTRAVENOUS
  Filled 2019-02-09: qty 10

## 2019-02-09 MED ORDER — FLUTICASONE PROPIONATE 50 MCG/ACT NA SUSP
2.0000 | Freq: Every day | NASAL | Status: DC
  Administered 2019-02-10 – 2019-02-12 (×3): 2 via NASAL
  Filled 2019-02-09: qty 16

## 2019-02-09 MED ORDER — DORZOLAMIDE HCL-TIMOLOL MAL 2-0.5 % OP SOLN
1.0000 [drp] | Freq: Two times a day (BID) | OPHTHALMIC | Status: DC
  Administered 2019-02-09 – 2019-02-12 (×7): 1 [drp] via OPHTHALMIC
  Filled 2019-02-09 (×2): qty 10

## 2019-02-09 MED ORDER — ENSURE ENLIVE PO LIQD
237.0000 mL | Freq: Two times a day (BID) | ORAL | Status: DC
  Administered 2019-02-09 – 2019-02-12 (×7): 237 mL via ORAL

## 2019-02-09 MED ORDER — GADOBUTROL 1 MMOL/ML IV SOLN
5.0000 mL | Freq: Once | INTRAVENOUS | Status: AC | PRN
  Administered 2019-02-09: 13:00:00 5 mL via INTRAVENOUS

## 2019-02-09 MED ORDER — INFLUENZA VAC A&B SA ADJ QUAD 0.5 ML IM PRSY
0.5000 mL | PREFILLED_SYRINGE | INTRAMUSCULAR | Status: AC
  Administered 2019-02-10: 0.5 mL via INTRAMUSCULAR
  Filled 2019-02-09: qty 0.5

## 2019-02-09 MED ORDER — ONDANSETRON HCL 4 MG/2ML IJ SOLN
INTRAMUSCULAR | Status: AC
  Administered 2019-02-09: 4 mg via INTRAVENOUS
  Filled 2019-02-09: qty 2

## 2019-02-09 MED ORDER — FUROSEMIDE 40 MG PO TABS
40.0000 mg | ORAL_TABLET | Freq: Every day | ORAL | Status: DC
  Administered 2019-02-09: 12:00:00 40 mg via ORAL

## 2019-02-09 NOTE — Consult Note (Addendum)
Cardiology Consultation:   Patient ID: Linda Donaldson: [id number]; DOB: 1922-05-25  Admit date: 02/08/2019 Date of Consult: 02/09/2019  Primary Care Provider: Elias Else, MD Primary Cardiologist: Dietrich Pates, MD  new Primary Electrophysiologist:  None     Patient Profile:   Linda Donaldson is a 85 y.o. female with a hx of right vetebral stenosis who is being seen today for the evaluation of NSTEMI at the request of Dr. Lucianne Muss.  History of Present Illness:   Linda Donaldson is a 85 yr old female patient who lives alone, just quite driving in March, overall healthy who was walking with her walker when her legs gave out. She leaned against TV stand and lowered herself to the ground. No LOC, dizziness, syncope.Acute weakness of left leg.She complains of diarrhea for about 2 weeks. Code stroke was called. CT angio no high grade stenosis, occlusion of right vertebral artery which was old from 2014. Troponins 2559, 2547. Cardiology recommended heparin but neurologist wanted to hold off until MRI back. No chest pain or EKG changes.Takes Pravachol and Lasix alternating with Zaroxolyn every other day on her med list but she says she's only taking lasix daily for edema. She was told she could take an extra 1/2 if needed. Admits to occasional chest pain-like a pulled muscle that comes/goes in seconds and occurs at rest. Hasn't had in several months. Has noticed increased fatigue recently. Wants to go back to bed shortly after awakening.  Heart Pathway Score:     Past Medical History:  Diagnosis Date   Arthritis    Barrett esophagus    Brachiocephalic artery stenosis, right (HCC)    PER PT TOLD POSSIBLE PREVIOUS TIA DUE TO HX VISUAL DISTURBANCE   Common carotid artery stenosis    RIGHT   Complication of anesthesia    PONV   Cystitis    DDD (degenerative disc disease), lumbar    Diverticulosis    Fluid retention in legs    GERD (gastroesophageal reflux disease)    Glaucoma of  both eyes    H/O hiatal hernia    Headache(784.0)    History of esophageal dilatation    2007   History of kidney stones    Left inguinal hernia    Nocturia    Occlusion of right vertebral artery without cerebral infarction    NEUROLOGIST-  DR PENUMALLI ([facility])   Urgency of urination    Wears glasses     Past Surgical History:  Procedure Laterality Date   APPENDECTOMY  1945   CATARACT EXTRACTION W/ INTRAOCULAR LENS  IMPLANT, BILATERAL  2001   CHOLECYSTECTOMY  1994   CYSTOSCOPY WITH BIOPSY N/A 02/24/2013   Procedure: CYSTOSCOPY FULGERATION AND BLADDER BIOPSY;  Surgeon: Martina Sinner, MD;  Location: [facility];  Service: Urology;  Laterality: N/A;   DIRECT LARYNGOSCOPY  07-30-2008   W/ REMOVAL RIGHT VALLECULAR CYST   REMOVAL EAR CANAL CYST  1968   RIGHT OVARIAN CYSTECTOMY  1948   TEMPORAL ARTERY BIOPSY / LIGATION Left 01-03-2009   TONSILLECTOMY  1945   TUBAL LIGATION       Home Medications:  Prior to Admission medications   Medication Sig Start Date End Date Taking? Authorizing Provider  alendronate (FOSAMAX) 70 MG tablet Take 70 mg by mouth every 7 (seven) days. Take with a full glass of water on an empty stomach.   Yes [provider]  bimatoprost (LUMIGAN) 0.01 % SOLN Place 1 drop into both eyes at bedtime.  Yes [provider]  Calcium Carb-Cholecalciferol (CALCIUM 600/VITAMIN D3) 600-800 MG-UNIT TABS Take 1 tablet by mouth daily.   Yes [provider]  dorzolamide-timolol (COSOPT) 22.3-6.8 MG/ML ophthalmic solution Place 1 drop into both eyes 2 (two) times daily.   Yes [provider]  fluticasone (FLONASE) 50 MCG/ACT nasal spray Place 2 sprays into the nose as needed.    Yes [provider]  furosemide (LASIX) 40 MG tablet Take 40 mg by mouth daily. ALTERNATES W/ ZAROXOLYN EVERY OTHER DAY   Yes [provider]  omeprazole (PRILOSEC) 20 MG capsule Take 20 mg by mouth 2  (two) times daily.    Yes [provider]  pravastatin (PRAVACHOL) 40 MG tablet Take 40 mg by mouth daily.   Yes [provider]  trimethoprim (TRIMPEX) 100 MG tablet Take 100 mg by mouth daily.   Yes [provider]  acetaminophen (TYLENOL) 325 MG tablet Take 650 mg by mouth every 6 (six) hours as needed for pain.    [provider]  cyclobenzaprine (FLEXERIL) 10 MG tablet Take 10 mg by mouth at bedtime as needed for muscle spasms.    [provider]  HYDROcodone-acetaminophen (NORCO) 5-325 MG per tablet Take 1 tablet by mouth every 6 (six) hours as needed for pain. 02/24/13   Alfredo Martinez, MD  Meth-Hyo-M Salley Hews Phos-Ph Sal (URIBEL) 118 MG CAPS Take 1 capsule by mouth 4 (four) times daily as needed.    [provider]  metolazone (ZAROXOLYN) 5 MG tablet Take 15 mg by mouth every other day. ALTERNATES WITH LASIX EVERY OTHER DATE    [provider]  potassium chloride (K-DUR) 10 MEQ tablet Take 10-20 mEq by mouth 2 (two) times daily.     [provider]  traMADol (ULTRAM) 50 MG tablet Take 50 mg by mouth every 6 (six) hours as needed for pain.    [provider]  triamcinolone cream (KENALOG) 0.1 % Apply 1 application topically as needed.    [provider]  trolamine salicylate (ASPERCREME) 10 % cream Apply 1 application topically as needed.     [provider]    Inpatient Medications: Scheduled Meds:  Chlorhexidine Gluconate Cloth  6 each Topical Daily   clopidogrel  75 mg Oral Daily   feeding supplement (ENSURE ENLIVE)  237 mL Oral BID BM   fluticasone  2 spray Each Nare Daily   furosemide  40 mg Oral Daily   [START ON 02/10/2019] influenza vaccine adjuvanted  0.5 mL Intramuscular Tomorrow-1000   pantoprazole  40 mg Oral Daily   potassium chloride  10-20 mEq Oral BID   pravastatin  40 mg Oral q1800   trimethoprim  100 mg Oral Daily   Continuous Infusions:  PRN  Meds:   Allergies:    Allergies  Allergen Reactions   Aspirin Hives   Penicillins Hives   Ciprofloxacin Rash    Social History:   Social History   Socioeconomic History   Marital status: Widowed    Spouse name: Not on file   Number of children: 0   Years of education: Not on file   Highest education level: Not on file  Occupational History   Not on file  Social Needs   Financial resource strain: Not on file   Food insecurity    Worry: Not on file    Inability: Not on file   Transportation needs    Medical: Not on file    Non-medical: Not on file  Tobacco  Use   Smoking status: Former Smoker    Years: 15.00    Types: Cigarettes    Quit date: 02/20/1952    Years since quitting: 67.0   Smokeless tobacco: Never Used  Substance and Sexual Activity   Alcohol use: No   Drug use: No   Sexual activity: Not on file  Lifestyle   Physical activity    Days per week: Not on file    Minutes per session: Not on file   Stress: Not on file  Relationships   Social connections    Talks on phone: Not on file    Gets together: Not on file    Attends religious service: Not on file    Active member of club or organization: Not on file    Attends meetings of clubs or organizations: Not on file    Relationship status: Not on file   Intimate partner violence    Fear of current or ex partner: Not on file    Emotionally abused: Not on file    Physically abused: Not on file    Forced sexual activity: Not on file  Other Topics Concern   Not on file  Social History Narrative   Pt lives at home alone.   Caffeine Use: Does drink coffee.    Family History:    Family History  Problem Relation Age of Onset   Cancer Mother      ROS:  Please see the history of present illness.  Review of Systems  Constitution: Positive for decreased appetite and malaise/fatigue.  HENT: Negative.   Eyes: Negative.   Cardiovascular: Positive for chest pain.  Respiratory:  Negative.   Hematologic/Lymphatic: Negative.   Musculoskeletal: Positive for arthritis and stiffness. Negative for joint pain.  Gastrointestinal: Positive for diarrhea and heartburn.  Genitourinary: Negative.   Neurological: Positive for weakness.   All other ROS reviewed and negative.     Physical Exam/Data:   Vitals:   02/09/19 0800 02/09/19 0815 02/09/19 0830 02/09/19 0845  BP: 112/61 101/60 123/78 110/63  Pulse: 84 83 98 91  Resp: (!) 21 (!) 21 (!) 21 20  Temp:      TempSrc:      SpO2: 96% 95% 97% 97%  Weight:      Height:        Intake/Output Summary (Last 24 hours) at 02/09/2019 0909 Last data filed at 02/09/2019 0034 Gross per 24 hour  Intake 500 ml  Output --  Net 500 ml   Last 3 Weights 02/09/2019 02/08/2019 02/24/2013  Weight (lbs) 178 lb 9.2 oz 173 lb 181 lb  Weight (kg) 81 kg 78.472 kg 82.101 kg     Body mass index is 31.63 kg/m.  General:  Well nourished, well developed, in no acute distress HEENT: normal Lymph: no adenopathy Neck: no JVD Endocrine:  No thryomegaly Vascular: No carotid bruits; FA pulses 2+ bilaterally without bruits  Cardiac:  normal S1, S2; RRR; 1/6 systolic murmur LSB Lungs:  clear to auscultation bilaterally, no wheezing, rhonchi or rales  Abd: soft, nontender, no hepatomegaly -diarrhea in bed Ext: slight edema Musculoskeletal:  No deformities, BUE and BLE strength normal and equal Skin: warm and dry  Neuro:  CNs 2-12 intact, no focal abnormalities noted Psych:  Normal affect   EKG:  The EKG was personally reviewed and demonstrates:  NSR with PVC's, low voltage, minimal inf ST changes. Telemetry:  Telemetry was personally reviewed and demonstrates: NSR with frequent PVC's  Relevant CV Studies:  Laboratory Data:  High Sensitivity Troponin:   Recent Labs  Lab 02/08/19 2330 02/09/19 0023  TROPONINIHS 2,559* 2,547*     Chemistry Recent Labs  Lab 02/08/19 2330  NA 136  K 3.9  CL 105  CO2 22  GLUCOSE 137*  BUN 21   CREATININE 0.96  CALCIUM 9.9  GFRNONAA 51*  GFRAA 59*  ANIONGAP 9    Recent Labs  Lab 02/08/19 2330  PROT 7.2  ALBUMIN 3.8  AST 22  ALT 9  ALKPHOS 49  BILITOT 0.4   Hematology Recent Labs  Lab 02/08/19 2330  WBC 8.8  RBC 4.48  HGB 13.8  HCT 43.0  MCV 96.0  MCH 30.8  MCHC 32.1  RDW 12.8  PLT 213   BNP Recent Labs  Lab 02/08/19 2330  BNP 176.0*    DDimer No results for input(s): DDIMER in the last 168 hours.   Radiology/Studies:  Ct Angio Head W Or Wo Contrast  Result Date: 02/09/2019 CLINICAL DATA:  dizziness and weakness EXAM: CT ANGIOGRAPHY HEAD AND NECK TECHNIQUE: Multidetector CT imaging of the head and neck was performed using the standard protocol during bolus administration of intravenous contrast. Multiplanar CT image reconstructions and MIPs were obtained to evaluate the vascular anatomy. Carotid stenosis measurements (when applicable) are obtained utilizing NASCET criteria, using the distal internal carotid diameter as the denominator. CONTRAST:  19mL OMNIPAQUE IOHEXOL 350 MG/ML SOLN COMPARISON:  MRA 10/02/2012. FINDINGS: CTA NECK FINDINGS SKELETON: There is no bony spinal canal stenosis. No lytic or blastic lesion. OTHER NECK: Normal pharynx, larynx and major salivary glands. No cervical lymphadenopathy. Unremarkable thyroid gland. UPPER CHEST: No pneumothorax or pleural effusion. No nodules or masses. AORTIC ARCH: There is mild calcific atherosclerosis of the aortic arch. There is no aneurysm, dissection or hemodynamically significant stenosis of the visualized portion of the aorta. Conventional 3 vessel aortic branching pattern. The visualized proximal subclavian arteries are widely patent. RIGHT CAROTID SYSTEM: No dissection, occlusion or aneurysm. Mild atherosclerotic calcification at the carotid bifurcation without hemodynamically significant stenosis. LEFT CAROTID SYSTEM: No dissection, occlusion or aneurysm. Mild atherosclerotic calcification at the  carotid bifurcation without hemodynamically significant stenosis. VERTEBRAL ARTERIES: Left dominant configuration. Both origins are clearly patent. The right vertebral artery V3 segment is occluded, as it was on 10/02/2012. The left vertebral artery is normal. CTA HEAD FINDINGS POSTERIOR CIRCULATION: --Vertebral arteries: Both V4 segments are opacified. --Posterior inferior cerebellar arteries (PICA): Patent origins from the vertebral arteries. --Anterior inferior cerebellar arteries (AICA): Patent origins from the basilar artery. --Basilar artery: Normal. --Superior cerebellar arteries: Normal. --Posterior cerebral arteries: Normal. Both originate from the basilar artery. Posterior communicating arteries (p-comm) are diminutive or absent. ANTERIOR CIRCULATION: --Intracranial internal carotid arteries: Atherosclerotic calcification of the internal carotid arteries at the skull base without hemodynamically significant stenosis. --Anterior cerebral arteries (ACA): Normal. Both A1 segments are present. Patent anterior communicating artery (a-comm). --Middle cerebral arteries (MCA): Normal. VENOUS SINUSES: As permitted by contrast timing, patent. ANATOMIC VARIANTS: None Review of the MIP images confirms the above findings. IMPRESSION: 1. No intracranial arterial occlusion or high-grade stenosis. 2. Occlusion of the right vertebral artery V3 segment, which was occluded on the MRA of 10/02/2012 3. Aortic Atherosclerosis (ICD10-I70.0). Electronically Signed   By: Ulyses Jarred M.D.   On: 02/09/2019 02:50   Dg Chest 2 View  Result Date: 02/09/2019 CLINICAL DATA:  Weakness EXAM: CHEST - 2 VIEW COMPARISON:  05/27/2008 FINDINGS: No pleural effusion. Diffuse coarse interstitial opacity with mild reticulation suggesting component of chronic interstitial  lung disease. Streaky opacities in the upper lobes and left base. Normal cardiomediastinal silhouette. No pneumothorax. IMPRESSION: 1. Diffuse coarse and mildly reticular  interstitial opacity, suspect for component of chronic interstitial lung disease. Streaky opacities are present within the right greater than left upper lobe and the left lung base and may reflect acute superimposed pneumonia. Electronically Signed   By: Jasmine PangKim  Fujinaga M.D.   On: 02/09/2019 01:17   Ct Head Wo Contrast  Result Date: 02/09/2019 CLINICAL DATA:  Vertigo, episodic EXAM: CT HEAD WITHOUT CONTRAST TECHNIQUE: Contiguous axial images were obtained from the base of the skull through the vertex without intravenous contrast. COMPARISON:  None. FINDINGS: Brain: No evidence of acute territorial infarction, hemorrhage, hydrocephalus,extra-axial collection or mass lesion/mass effect. There is dilatation the ventricles and sulci consistent with age-related atrophy. Low-attenuation changes in the deep white matter consistent with small vessel ischemia. Vascular: No hyperdense vessel or unexpected calcification. Skull: The skull is intact. No fracture or focal lesion identified. Sinuses/Orbits: The visualized paranasal sinuses and mastoid air cells are clear. The orbits and globes intact. Other: None IMPRESSION: No acute intracranial abnormality. Findings consistent with age related atrophy and chronic small vessel ischemia Electronically Signed   By: Jonna ClarkBindu  Avutu M.D.   On: 02/09/2019 01:03   Ct Angio Neck W And/or Wo Contrast  Result Date: 02/09/2019 CLINICAL DATA:  dizziness and weakness EXAM: CT ANGIOGRAPHY HEAD AND NECK TECHNIQUE: Multidetector CT imaging of the head and neck was performed using the standard protocol during bolus administration of intravenous contrast. Multiplanar CT image reconstructions and MIPs were obtained to evaluate the vascular anatomy. Carotid stenosis measurements (when applicable) are obtained utilizing NASCET criteria, using the distal internal carotid diameter as the denominator. CONTRAST:  100mL OMNIPAQUE IOHEXOL 350 MG/ML SOLN COMPARISON:  MRA 10/02/2012. FINDINGS: CTA NECK  FINDINGS SKELETON: There is no bony spinal canal stenosis. No lytic or blastic lesion. OTHER NECK: Normal pharynx, larynx and major salivary glands. No cervical lymphadenopathy. Unremarkable thyroid gland. UPPER CHEST: No pneumothorax or pleural effusion. No nodules or masses. AORTIC ARCH: There is mild calcific atherosclerosis of the aortic arch. There is no aneurysm, dissection or hemodynamically significant stenosis of the visualized portion of the aorta. Conventional 3 vessel aortic branching pattern. The visualized proximal subclavian arteries are widely patent. RIGHT CAROTID SYSTEM: No dissection, occlusion or aneurysm. Mild atherosclerotic calcification at the carotid bifurcation without hemodynamically significant stenosis. LEFT CAROTID SYSTEM: No dissection, occlusion or aneurysm. Mild atherosclerotic calcification at the carotid bifurcation without hemodynamically significant stenosis. VERTEBRAL ARTERIES: Left dominant configuration. Both origins are clearly patent. The right vertebral artery V3 segment is occluded, as it was on 10/02/2012. The left vertebral artery is normal. CTA HEAD FINDINGS POSTERIOR CIRCULATION: --Vertebral arteries: Both V4 segments are opacified. --Posterior inferior cerebellar arteries (PICA): Patent origins from the vertebral arteries. --Anterior inferior cerebellar arteries (AICA): Patent origins from the basilar artery. --Basilar artery: Normal. --Superior cerebellar arteries: Normal. --Posterior cerebral arteries: Normal. Both originate from the basilar artery. Posterior communicating arteries (p-comm) are diminutive or absent. ANTERIOR CIRCULATION: --Intracranial internal carotid arteries: Atherosclerotic calcification of the internal carotid arteries at the skull base without hemodynamically significant stenosis. --Anterior cerebral arteries (ACA): Normal. Both A1 segments are present. Patent anterior communicating artery (a-comm). --Middle cerebral arteries (MCA): Normal.  VENOUS SINUSES: As permitted by contrast timing, patent. ANATOMIC VARIANTS: None Review of the MIP images confirms the above findings. IMPRESSION: 1. No intracranial arterial occlusion or high-grade stenosis. 2. Occlusion of the right vertebral artery V3 segment, which was occluded  on the MRA of 10/02/2012 3. Aortic Atherosclerosis (ICD10-I70.0). Electronically Signed   By: Deatra Robinson M.D.   On: 02/09/2019 02:50    Assessment and Plan:   1. NSTEMI Troponins 2559, 2547, no chest pain or EKG changes.vague symptoms of chest pain at rest in the past but not recent.Heparin on hold until MRI done. Echo pending. BP will not allow [place] to add any meds at this time. 2. Possible CVA with acute leg weakness-CTA unreveiling, MRI pending on Plavix 3. Chronic lower extremity edema takes daily Lasix BNP 176 4. Diarrhea for several weeks.thinks she drank spoiled milk 5. HLD on pravastatin LDL 74 6. ? Pneumonia on CXR      For questions or updates, please contact [facility] Please consult [email or web address] for contact info under     Signed, Jacolyn Reedy, PA-C  02/09/2019 9:09 AM   Pt seen and examined.  85 yo with no prior cardiac history     Presents after weak spell   She says over past few weeks she has noticed increased fatiguability     Dneies CP   Does have some SOB with activity        Weak, gave out on at home     She is being evaluated for CVA CT sows atherosclerosis (mild) of arch and carotid arteries; R vertebral occluded (old)   MRI pending    Trop positive at 2559 peak    EKG today   SR   92 bpm   Sl sagging of ST segments inferiorly   Q waves I and AVL   Currently not on anticoagulation  Pending neuro eval  She is pain free   Echo is pending       Would continue to provid e supportive care Further recomm based in echo results   Continue pravastatin    Dietrich Pates MD

## 2019-02-09 NOTE — Progress Notes (Signed)
*  PRELIMINARY RESULTS* Echocardiogram 2D Echocardiogram has been performed with Definity.  Samuel Germany 02/09/2019, 5:17 PM

## 2019-02-09 NOTE — ED Notes (Signed)
Date and time results received: 02/09/19 0030  Test: Troponin Critical Value: 2559  Name of Provider Notified: Rancour  Orders Received? Or Actions Taken?: repeat EKG

## 2019-02-09 NOTE — ED Notes (Signed)
ED TO INPATIENT HANDOFF REPORT  ED Nurse Name and Phone #:  Waynetta Sandy [phone]  S Name/Age/Gender Briggette L Aaberg 85 y.o. female Room/Bed: APA03/APA03  Code Status   Code Status: Not on file  Home/SNF/Other Home Patient oriented to: self, place, time and situation Is this baseline? Yes   Triage Complete: Triage complete  Chief Complaint Weakness  Triage Note Per [facility] they were called out for fall around 1900 but refused to come to ER, called back for weakness and dizziness, v/s stable, per pt she has been feeling weak today, worsening tonight    Allergies Allergies  Allergen Reactions  . Aspirin Hives  . Penicillins Hives  . Ciprofloxacin Rash    Level of Care/Admitting Diagnosis ED Disposition    ED Disposition Condition Comment   Admit  Hospital Area: [facility] [[id number]]  Level of Care: Stepdown [14]  Covid Evaluation: N/A  Diagnosis: Stroke-like symptom [[id number]]  Admitting Physician: Kirstie Peri [[id number]]  Attending Physician: Kirstie Peri [[id number]]  Estimated length of stay: 3 - 4 days  Certification:: I certify this patient will need inpatient services for at least 2 midnights  PT Class (Do Not Modify): Inpatient [101]  PT Acc Code (Do Not Modify): Private [1]       B Medical/Surgery History Past Medical History:  Diagnosis Date  . Arthritis   . Barrett esophagus   . Brachiocephalic artery stenosis, right (HCC)    PER PT TOLD POSSIBLE PREVIOUS TIA DUE TO HX VISUAL DISTURBANCE  . Common carotid artery stenosis    RIGHT  . Complication of anesthesia    PONV  . Cystitis   . DDD (degenerative disc disease), lumbar   . Diverticulosis   . Fluid retention in legs   . GERD (gastroesophageal reflux disease)   . Glaucoma of both eyes   . H/O hiatal hernia   . Headache(784.0)   . History of esophageal dilatation    2007  . History of kidney stones   . Left inguinal hernia   . Nocturia   . Occlusion of right vertebral artery without  cerebral infarction    NEUROLOGIST-  DR PENUMALLI ([facility])  . Urgency of urination   . Wears glasses    Past Surgical History:  Procedure Laterality Date  . APPENDECTOMY  1945  . CATARACT EXTRACTION W/ INTRAOCULAR LENS  IMPLANT, BILATERAL  2001  . CHOLECYSTECTOMY  1994  . CYSTOSCOPY WITH BIOPSY N/A 02/24/2013   Procedure: CYSTOSCOPY FULGERATION AND BLADDER BIOPSY;  Surgeon: Martina Sinner, MD;  Location: [facility];  Service: Urology;  Laterality: N/A;  . DIRECT LARYNGOSCOPY  07-30-2008   W/ REMOVAL RIGHT VALLECULAR CYST  . REMOVAL EAR CANAL CYST  1968  . RIGHT OVARIAN CYSTECTOMY  1948  . TEMPORAL ARTERY BIOPSY / LIGATION Left 01-03-2009  . TONSILLECTOMY  1945  . TUBAL LIGATION       A IV Location/Drains/Wounds Patient Lines/Drains/Airways Status   Active Line/Drains/Airways    Name:   Placement date:   Placement time:   Site:   Days:   Peripheral IV 02/08/19 Right Antecubital   02/08/19    2338    Antecubital   1   Incision 02/24/13 Perineum Other (Comment)   02/24/13    1033     2176          Intake/Output Last 24 hours  Intake/Output Summary (Last 24 hours) at 02/09/2019 0359 Last data filed at 02/09/2019 0034 Gross per 24 hour  Intake 500  ml  Output -  Net 500 ml    Labs/Imaging Results for orders placed or performed during the hospital encounter of 02/08/19 (from the past 48 hour(s))  CBC with Differential/Platelet     Status: None   Collection Time: 02/08/19 11:30 PM  Result Value Ref Range   WBC 8.8 4.0 - 10.5 K/uL   RBC 4.48 3.87 - 5.11 MIL/uL   Hemoglobin 13.8 12.0 - 15.0 g/dL   HCT 22.9 79.8 - 92.1 %   MCV 96.0 80.0 - 100.0 fL   MCH 30.8 26.0 - 34.0 pg   MCHC 32.1 30.0 - 36.0 g/dL   RDW 19.4 17.4 - 08.1 %   Platelets 213 150 - 400 K/uL   nRBC 0.0 0.0 - 0.2 %   Neutrophils Relative % 66 %   Neutro Abs 5.8 1.7 - 7.7 K/uL   Lymphocytes Relative 22 %   Lymphs Abs 1.9 0.7 - 4.0 K/uL   Monocytes Relative 10 %   Monocytes  Absolute 0.9 0.1 - 1.0 K/uL   Eosinophils Relative 2 %   Eosinophils Absolute 0.2 0.0 - 0.5 K/uL   Basophils Relative 0 %   Basophils Absolute 0.0 0.0 - 0.1 K/uL   Immature Granulocytes 0 %   Abs Immature Granulocytes 0.02 0.00 - 0.07 K/uL    Comment: Performed at [facility], [address] [place], [place] [address]  Comprehensive metabolic panel     Status: Abnormal   Collection Time: 02/08/19 11:30 PM  Result Value Ref Range   Sodium 136 135 - 145 mmol/L   Potassium 3.9 3.5 - 5.1 mmol/L   Chloride 105 98 - 111 mmol/L   CO2 22 22 - 32 mmol/L   Glucose, Bld 137 (H) 70 - 99 mg/dL   BUN 21 8 - 23 mg/dL   Creatinine, Ser 5.63 0.44 - 1.00 mg/dL   Calcium 9.9 8.9 - 14.9 mg/dL   Total Protein 7.2 6.5 - 8.1 g/dL   Albumin 3.8 3.5 - 5.0 g/dL   AST 22 15 - 41 U/L   ALT 9 0 - 44 U/L   Alkaline Phosphatase 49 38 - 126 U/L   Total Bilirubin 0.4 0.3 - 1.2 mg/dL   GFR calc non Af Amer 51 (L) >60 mL/min   GFR calc Af Amer 59 (L) >60 mL/min   Anion gap 9 5 - 15    Comment: Performed at [facility], [address], [place], [place] [address]  Troponin I (High Sensitivity)     Status: Abnormal   Collection Time: 02/08/19 11:30 PM  Result Value Ref Range   Troponin I (High Sensitivity) 2,559 (HH) <18 ng/L    Comment: CRITICAL RESULT CALLED TO, READ BACK BY AND VERIFIED WITH: Jazmarie Biever,B @ 0029 ON 02/09/19 BY JUW Performed at [facility], [address] [place], [place] [address]   Brain natriuretic peptide     Status: Abnormal   Collection Time: 02/08/19 11:30 PM  Result Value Ref Range   B Natriuretic Peptide 176.0 (H) 0.0 - 100.0 pg/mL    Comment: Performed at [facility], [address], [place], [place] [address]  Lactic acid, plasma     Status: None   Collection Time: 02/08/19 11:30 PM  Result Value Ref Range   Lactic Acid, Venous 1.4 0.5 - 1.9 mmol/L    Comment: Performed at [facility], [address], [place], [place] [address]  Lactic acid, plasma     Status: None   Collection  Time: 02/09/19  12:23 AM  Result Value Ref Range   Lactic Acid, Venous 1.2 0.5 - 1.9 mmol/L    Comment: Performed at [facility], [address], [place], [place] [address]  Blood culture (routine x 2)     Status: None (Preliminary result)   Collection Time: 02/09/19 12:23 AM   Specimen: BLOOD LEFT FOREARM  Result Value Ref Range   Specimen Description BLOOD LEFT FOREARM    Special Requests      BOTTLES DRAWN AEROBIC AND ANAEROBIC Blood Culture adequate volume Performed at [facility], [address] [place], [place] [address]    Culture PENDING    Report Status PENDING   Troponin I (High Sensitivity)     Status: Abnormal   Collection Time: 02/09/19 12:23 AM  Result Value Ref Range   Troponin I (High Sensitivity) 2,547 (HH) <18 ng/L    Comment: CRITICAL VALUE NOTED.  VALUE IS CONSISTENT WITH PREVIOUSLY REPORTED AND CALLED VALUE. Performed at [facility], [address] [place], [place] [address]   Blood culture (routine x 2)     Status: None (Preliminary result)   Collection Time: 02/09/19 12:35 AM   Specimen: BLOOD RIGHT HAND  Result Value Ref Range   Specimen Description BLOOD RIGHT HAND    Special Requests      BOTTLES DRAWN AEROBIC AND ANAEROBIC Blood Culture adequate volume Performed at [facility], [address], [place], [place] [address]    Culture PENDING    Report Status PENDING    Ct Angio Head W Or Wo Contrast  Result Date: 02/09/2019 CLINICAL DATA:  dizziness and weakness EXAM: CT ANGIOGRAPHY HEAD AND NECK TECHNIQUE: Multidetector CT imaging of the head and neck was performed using the standard protocol during bolus administration of intravenous contrast. Multiplanar CT image reconstructions and MIPs were obtained to evaluate the vascular anatomy. Carotid stenosis measurements (when applicable) are obtained utilizing NASCET criteria, using the distal internal carotid diameter as the denominator. CONTRAST:  147mL OMNIPAQUE IOHEXOL 350 MG/ML SOLN COMPARISON:  MRA  10/02/2012. FINDINGS: CTA NECK FINDINGS SKELETON: There is no bony spinal canal stenosis. No lytic or blastic lesion. OTHER NECK: Normal pharynx, larynx and major salivary glands. No cervical lymphadenopathy. Unremarkable thyroid gland. UPPER CHEST: No pneumothorax or pleural effusion. No nodules or masses. AORTIC ARCH: There is mild calcific atherosclerosis of the aortic arch. There is no aneurysm, dissection or hemodynamically significant stenosis of the visualized portion of the aorta. Conventional 3 vessel aortic branching pattern. The visualized proximal subclavian arteries are widely patent. RIGHT CAROTID SYSTEM: No dissection, occlusion or aneurysm. Mild atherosclerotic calcification at the carotid bifurcation without hemodynamically significant stenosis. LEFT CAROTID SYSTEM: No dissection, occlusion or aneurysm. Mild atherosclerotic calcification at the carotid bifurcation without hemodynamically significant stenosis. VERTEBRAL ARTERIES: Left dominant configuration. Both origins are clearly patent. The right vertebral artery V3 segment is occluded, as it was on 10/02/2012. The left vertebral artery is normal. CTA HEAD FINDINGS POSTERIOR CIRCULATION: --Vertebral arteries: Both V4 segments are opacified. --Posterior inferior cerebellar arteries (PICA): Patent origins from the vertebral arteries. --Anterior inferior cerebellar arteries (AICA): Patent origins from the basilar artery. --Basilar artery: Normal. --Superior cerebellar arteries: Normal. --Posterior cerebral arteries: Normal. Both originate from the basilar artery. Posterior communicating arteries (p-comm) are diminutive or absent. ANTERIOR CIRCULATION: --Intracranial internal carotid arteries: Atherosclerotic calcification of the internal carotid arteries at the skull base without hemodynamically significant stenosis. --Anterior cerebral arteries (ACA): Normal. Both A1 segments are present. Patent anterior communicating artery (a-comm). --Middle  cerebral arteries (MCA): Normal. VENOUS  SINUSES: As permitted by contrast timing, patent. ANATOMIC VARIANTS: None Review of the MIP images confirms the above findings. IMPRESSION: 1. No intracranial arterial occlusion or high-grade stenosis. 2. Occlusion of the right vertebral artery V3 segment, which was occluded on the MRA of 10/02/2012 3. Aortic Atherosclerosis (ICD10-I70.0). Electronically Signed   By: Deatra Robinson M.D.   On: 02/09/2019 02:50   Dg Chest 2 View  Result Date: 02/09/2019 CLINICAL DATA:  Weakness EXAM: CHEST - 2 VIEW COMPARISON:  05/27/2008 FINDINGS: No pleural effusion. Diffuse coarse interstitial opacity with mild reticulation suggesting component of chronic interstitial lung disease. Streaky opacities in the upper lobes and left base. Normal cardiomediastinal silhouette. No pneumothorax. IMPRESSION: 1. Diffuse coarse and mildly reticular interstitial opacity, suspect for component of chronic interstitial lung disease. Streaky opacities are present within the right greater than left upper lobe and the left lung base and may reflect acute superimposed pneumonia. Electronically Signed   By: Jasmine Pang M.D.   On: 02/09/2019 01:17   Ct Head Wo Contrast  Result Date: 02/09/2019 CLINICAL DATA:  Vertigo, episodic EXAM: CT HEAD WITHOUT CONTRAST TECHNIQUE: Contiguous axial images were obtained from the base of the skull through the vertex without intravenous contrast. COMPARISON:  None. FINDINGS: Brain: No evidence of acute territorial infarction, hemorrhage, hydrocephalus,extra-axial collection or mass lesion/mass effect. There is dilatation the ventricles and sulci consistent with age-related atrophy. Low-attenuation changes in the deep white matter consistent with small vessel ischemia. Vascular: No hyperdense vessel or unexpected calcification. Skull: The skull is intact. No fracture or focal lesion identified. Sinuses/Orbits: The visualized paranasal sinuses and mastoid air cells are  clear. The orbits and globes intact. Other: None IMPRESSION: No acute intracranial abnormality. Findings consistent with age related atrophy and chronic small vessel ischemia Electronically Signed   By: Jonna Clark M.D.   On: 02/09/2019 01:03   Ct Angio Neck W And/or Wo Contrast  Result Date: 02/09/2019 CLINICAL DATA:  dizziness and weakness EXAM: CT ANGIOGRAPHY HEAD AND NECK TECHNIQUE: Multidetector CT imaging of the head and neck was performed using the standard protocol during bolus administration of intravenous contrast. Multiplanar CT image reconstructions and MIPs were obtained to evaluate the vascular anatomy. Carotid stenosis measurements (when applicable) are obtained utilizing NASCET criteria, using the distal internal carotid diameter as the denominator. CONTRAST:  OMNIPAQUE IOHEXOL 350 MG/ML SOLN COMPARISON:  MRA 10/02/2012. FINDINGS: CTA NECK FINDINGS SKELETON: There is no bony spinal canal stenosis. No lytic or blastic lesion. OTHER NECK: Normal pharynx, larynx and major salivary glands. No cervical lymphadenopathy. Unremarkable thyroid gland. UPPER CHEST: No pneumothorax or pleural effusion. No nodules or masses. AORTIC ARCH: There is mild calcific atherosclerosis of the aortic arch. There is no aneurysm, dissection or hemodynamically significant stenosis of the visualized portion of the aorta. Conventional 3 vessel aortic branching pattern. The visualized proximal subclavian arteries are widely patent. RIGHT CAROTID SYSTEM: No dissection, occlusion or aneurysm. Mild atherosclerotic calcification at the carotid bifurcation without hemodynamically significant stenosis. LEFT CAROTID SYSTEM: No dissection, occlusion or aneurysm. Mild atherosclerotic calcification at the carotid bifurcation without hemodynamically significant stenosis. VERTEBRAL ARTERIES: Left dominant configuration. Both origins are clearly patent. The right vertebral artery V3 segment is occluded, as it was on 10/02/2012. The  left vertebral artery is normal. CTA HEAD FINDINGS POSTERIOR CIRCULATION: --Vertebral arteries: Both V4 segments are opacified. --Posterior inferior cerebellar arteries (PICA): Patent origins from the vertebral arteries. --Anterior inferior cerebellar arteries (AICA): Patent origins from the basilar artery. --Basilar artery: Normal. --Superior cerebellar arteries: Normal. --  Posterior cerebral arteries: Normal. Both originate from the basilar artery. Posterior communicating arteries (p-comm) are diminutive or absent. ANTERIOR CIRCULATION: --Intracranial internal carotid arteries: Atherosclerotic calcification of the internal carotid arteries at the skull base without hemodynamically significant stenosis. --Anterior cerebral arteries (ACA): Normal. Both A1 segments are present. Patent anterior communicating artery (a-comm). --Middle cerebral arteries (MCA): Normal. VENOUS SINUSES: As permitted by contrast timing, patent. ANATOMIC VARIANTS: None Review of the MIP images confirms the above findings. IMPRESSION: 1. No intracranial arterial occlusion or high-grade stenosis. 2. Occlusion of the right vertebral artery V3 segment, which was occluded on the MRA of 10/02/2012 3. Aortic Atherosclerosis (ICD10-I70.0). Electronically Signed   By: Deatra RobinsonKevin  Herman M.D.   On: 02/09/2019 02:50    Pending Labs Unresulted Labs (From admission, onward)    Start     Ordered   02/08/19 2338  Urinalysis, Routine w reflex microscopic  Once,   STAT     02/08/19 2338   Signed and Held  Hemoglobin A1c  Once,   R     Signed and Held          Vitals/Pain Today's Vitals   02/09/19 0030 02/09/19 0100 02/09/19 0200 02/09/19 0215  BP: (!) 146/90 132/78 122/86   Pulse: 95 90  97  Resp:   (!) 23 16  Temp:      TempSrc:      SpO2: 96% 95%  92%  Weight:      Height:      PainSc:        Isolation Precautions No active isolations  Medications Medications  sodium chloride 0.9 % bolus 500 mL (0 mLs Intravenous Stopped 02/09/19  0034)  ondansetron (ZOFRAN) injection 4 mg (4 mg Intravenous Given 02/09/19 0135)  iohexol (OMNIPAQUE) 350 MG/ML injection 100 mL (100 mLs Intravenous Contrast Given 02/09/19 0223)  clopidogrel (PLAVIX) tablet 75 mg (75 mg Oral Given 02/09/19 0239)    Mobility walks with person assist High fall risk   Focused Assessments    R Recommendations: See Admitting Provider Note  Report given to:   Additional Notes:

## 2019-02-09 NOTE — H&P (Signed)
History and Physical    Linda Donaldson DJM:[id number] DOB: Sep 12, 1922 DOA: 02/08/2019  PCP: Elias Else, MD (Confirm with patient/family/NH records and if not entered, this has to be entered at [facility] point of entry) Patient coming from: Home   I have personally briefly reviewed patient's old medical records in [facility]  Chief Complaint: Left leg weakness and dizziness   HPI:  85 year old woman ADL/IADL independent with past medical history is reviewed in the EMR was brought to the ER with complaint of generalized weakness and dizziness.  As per patient she has not been feeling well throughout on 02/08/2019.  She states that yesterday evening around 6 PM she was walking with her walker when her legs gave way and she leaned against a TV cabinet and lowered herself down to the ground without hitting her head.  She had no loss of consciousness no bladder or bowel incontinence.  No blackout, any preceding dizziness or headache.  At the time of my interview patient stated that she has been having these symptoms for past 8 to 10 days which she describes as a feeling of unwell'' something passing through her body''.  She had no complaint of fever, chill, cough, congestion, nausea, vomiting, diarrhea, chest pain, shortness of breath, palpitation, headache and blurring of vision.  No tingling or numbness sensation at the time of interview.  As she complained of acute weakness in her left leg and was symptomatic for strokelike features code stroke was called.  Telemetry neurologist recommended against TPA as her symptoms were more than 24 hours duration.  She was evaluated with CT Angie of head and neck which was impressive of No intracranial arterial occlusion or high-grade stenosis.2. Occlusion of the right vertebral artery V3 segment, which was occluded on the MRA of 10/02/2012.  Lab work was significant for markedly elevated high sensitive troponins [phone], cardiology was consulted the  recommendation was to heparinize the patient but neurologist recommended to wait for the MRI before heparinizing the patient.  Patient admitted for MRI  To r/o acute stroke and cardiac eval in the morning.  Review of Systems: As per HPI all other systems reviewed and negative.  Past Medical History:  Diagnosis Date   Arthritis    Barrett esophagus    Brachiocephalic artery stenosis, right (HCC)    PER PT TOLD POSSIBLE PREVIOUS TIA DUE TO HX VISUAL DISTURBANCE   Common carotid artery stenosis    RIGHT   Complication of anesthesia    PONV   Cystitis    DDD (degenerative disc disease), lumbar    Diverticulosis    Fluid retention in legs    GERD (gastroesophageal reflux disease)    Glaucoma of both eyes    H/O hiatal hernia    Headache(784.0)    History of esophageal dilatation    2007   History of kidney stones    Left inguinal hernia    Nocturia    Occlusion of right vertebral artery without cerebral infarction    NEUROLOGIST-  DR PENUMALLI ([facility])   Urgency of urination    Wears glasses     Past Surgical History:  Procedure Laterality Date   APPENDECTOMY  1945   CATARACT EXTRACTION W/ INTRAOCULAR LENS  IMPLANT, BILATERAL  2001   CHOLECYSTECTOMY  1994   CYSTOSCOPY WITH BIOPSY N/A 02/24/2013   Procedure: CYSTOSCOPY FULGERATION AND BLADDER BIOPSY;  Surgeon: Martina Sinner, MD;  Location: [facility];  Service: Urology;  Laterality: N/A;   DIRECT  LARYNGOSCOPY  07-30-2008   W/ REMOVAL RIGHT VALLECULAR CYST   REMOVAL EAR CANAL CYST  1968   RIGHT OVARIAN CYSTECTOMY  1948   TEMPORAL ARTERY BIOPSY / LIGATION Left 01-03-2009   TONSILLECTOMY  1945   TUBAL LIGATION       reports that she quit smoking about 67 years ago. Her smoking use included cigarettes. She quit after 15.00 years of use. She has never used smokeless tobacco. She reports that she does not drink alcohol or use drugs.  Allergies  Allergen Reactions    Aspirin Hives   Penicillins Hives   Ciprofloxacin Rash    Family History  Problem Relation Age of Onset   Cancer Mother     Acceptable: Family history reviewed and not pertinent (If you reviewed it)  Prior to Admission medications   Medication Sig Start Date End Date Taking? Authorizing Provider  alendronate (FOSAMAX) 70 MG tablet Take 70 mg by mouth every 7 (seven) days. Take with a full glass of water on an empty stomach.   Yes [provider]  bimatoprost (LUMIGAN) 0.01 % SOLN Place 1 drop into both eyes at bedtime.    Yes [provider]  Calcium Carb-Cholecalciferol (CALCIUM 600/VITAMIN D3) 600-800 MG-UNIT TABS Take 1 tablet by mouth daily.   Yes [provider]  dorzolamide-timolol (COSOPT) 22.3-6.8 MG/ML ophthalmic solution Place 1 drop into both eyes 2 (two) times daily.   Yes [provider]  fluticasone (FLONASE) 50 MCG/ACT nasal spray Place 2 sprays into the nose as needed.    Yes [provider]  furosemide (LASIX) 40 MG tablet Take 40 mg by mouth daily. ALTERNATES W/ ZAROXOLYN EVERY OTHER DAY   Yes [provider]  omeprazole (PRILOSEC) 20 MG capsule Take 20 mg by mouth 2 (two) times daily.    Yes [provider]  pravastatin (PRAVACHOL) 40 MG tablet Take 40 mg by mouth daily.   Yes [provider]  trimethoprim (TRIMPEX) 100 MG tablet Take 100 mg by mouth daily.   Yes [provider]  acetaminophen (TYLENOL) 325 MG tablet Take 650 mg by mouth every 6 (six) hours as needed for pain.    [provider]  cyclobenzaprine (FLEXERIL) 10 MG tablet Take 10 mg by mouth at bedtime as needed for muscle spasms.    [provider]  HYDROcodone-acetaminophen (NORCO) 5-325 MG per tablet Take 1 tablet by mouth every 6 (six) hours as needed for pain. 02/24/13   Alfredo Martinez, MD  Meth-Hyo-M Salley Hews Phos-Ph Sal (URIBEL) 118 MG CAPS Take 1 capsule by mouth 4 (four) times daily as needed.     [provider]  metolazone (ZAROXOLYN) 5 MG tablet Take 15 mg by mouth every other day. ALTERNATES WITH LASIX EVERY OTHER DATE    [provider]  potassium chloride (K-DUR) 10 MEQ tablet Take 10-20 mEq by mouth 2 (two) times daily.     [provider]  traMADol (ULTRAM) 50 MG tablet Take 50 mg by mouth every 6 (six) hours as needed for pain.    [provider]  triamcinolone cream (KENALOG) 0.1 % Apply 1 application topically as needed.    [provider]  trolamine salicylate (ASPERCREME) 10 % cream Apply 1 application topically as needed.     [provider]    Physical Exam: Vitals:   02/09/19 0030 02/09/19 0100 02/09/19 0200 02/09/19 0215  BP: (!) 146/90 132/78 122/86   Pulse: 95 90  97  Resp:   Marland Kitchen)  23 16  Temp:      TempSrc:      SpO2: 96% 95%  92%  Weight:      Height:        Constitutional: NAD, calm, comfortable Vitals:   02/09/19 0030 02/09/19 0100 02/09/19 0200 02/09/19 0215  BP: (!) 146/90 132/78 122/86   Pulse: 95 90  97  Resp:   (!) 23 16  Temp:      TempSrc:      SpO2: 96% 95%  92%  Weight:      Height:       Eyes: PERRL, lids and conjunctivae normal ENMT: Mucous membranes are moist. Posterior pharynx clear of any exudate or lesions.Normal dentition.  Neck: normal, supple, no masses, no thyromegaly Respiratory: clear to auscultation bilaterally, no wheezing, no crackles. Normal respiratory effort. No accessory muscle use.  Cardiovascular: Regular rate and rhythm, no murmurs / rubs / gallops. No extremity edema. 2+ pedal pulses. No carotid bruits.  Abdomen: no tenderness, no masses palpated. No hepatosplenomegaly. Bowel sounds positive.  Musculoskeletal: 2+ pitting edema B/L    Skin: no rashes, lesions, ulcers. No induration Neurologic: CN 2-12 grossly intact. Left Nasolabial prominence with asymetrical smile  Psychiatric: Normal judgment and insight. Alert and oriented x 3. Normal mood.   Labs on  Admission: I have personally reviewed following labs and imaging studies  CBC: Recent Labs  Lab 02/08/19 2330  WBC 8.8  NEUTROABS 5.8  HGB 13.8  HCT 43.0  MCV 96.0  PLT 213   Basic Metabolic Panel: Recent Labs  Lab 02/08/19 2330  NA 136  K 3.9  CL 105  CO2 22  GLUCOSE 137*  BUN 21  CREATININE 0.96  CALCIUM 9.9   GFR: Estimated Creatinine Clearance: 36 mL/min (by C-G formula based on SCr of 0.96 mg/dL). Liver Function Tests: Recent Labs  Lab 02/08/19 2330  AST 22  ALT 9  ALKPHOS 49  BILITOT 0.4  PROT 7.2  ALBUMIN 3.8   No results for input(s): LIPASE, AMYLASE in the last 168 hours. No results for input(s): AMMONIA in the last 168 hours. Coagulation Profile: No results for input(s): INR, PROTIME in the last 168 hours. Cardiac Enzymes: No results for input(s): CKTOTAL, CKMB, CKMBINDEX, TROPONINI in the last 168 hours. BNP (last 3 results) No results for input(s): PROBNP in the last 8760 hours. HbA1C: No results for input(s): HGBA1C in the last 72 hours. CBG: No results for input(s): GLUCAP in the last 168 hours. Lipid Profile: No results for input(s): CHOL, HDL, LDLCALC, TRIG, CHOLHDL, LDLDIRECT in the last 72 hours. Thyroid Function Tests: No results for input(s): TSH, T4TOTAL, FREET4, T3FREE, THYROIDAB in the last 72 hours. Anemia Panel: No results for input(s): VITAMINB12, FOLATE, FERRITIN, TIBC, IRON, RETICCTPCT in the last 72 hours. Urine analysis: No results found for: COLORURINE, APPEARANCEUR, LABSPEC, PHURINE, GLUCOSEU, HGBUR, BILIRUBINUR, KETONESUR, PROTEINUR, UROBILINOGEN, NITRITE, LEUKOCYTESUR  Radiological Exams on Admission: Ct Angio Head W Or Wo Contrast  Result Date: 02/09/2019 CLINICAL DATA:  dizziness and weakness EXAM: CT ANGIOGRAPHY HEAD AND NECK TECHNIQUE: Multidetector CT imaging of the head and neck was performed using the standard protocol during bolus administration of intravenous contrast. Multiplanar CT image reconstructions and  MIPs were obtained to evaluate the vascular anatomy. Carotid stenosis measurements (when applicable) are obtained utilizing NASCET criteria, using the distal internal carotid diameter as the denominator. CONTRAST:  100mL OMNIPAQUE IOHEXOL 350 MG/ML SOLN COMPARISON:  MRA 10/02/2012. FINDINGS: CTA NECK FINDINGS SKELETON: There is no bony spinal canal  stenosis. No lytic or blastic lesion. OTHER NECK: Normal pharynx, larynx and major salivary glands. No cervical lymphadenopathy. Unremarkable thyroid gland. UPPER CHEST: No pneumothorax or pleural effusion. No nodules or masses. AORTIC ARCH: There is mild calcific atherosclerosis of the aortic arch. There is no aneurysm, dissection or hemodynamically significant stenosis of the visualized portion of the aorta. Conventional 3 vessel aortic branching pattern. The visualized proximal subclavian arteries are widely patent. RIGHT CAROTID SYSTEM: No dissection, occlusion or aneurysm. Mild atherosclerotic calcification at the carotid bifurcation without hemodynamically significant stenosis. LEFT CAROTID SYSTEM: No dissection, occlusion or aneurysm. Mild atherosclerotic calcification at the carotid bifurcation without hemodynamically significant stenosis. VERTEBRAL ARTERIES: Left dominant configuration. Both origins are clearly patent. The right vertebral artery V3 segment is occluded, as it was on 10/02/2012. The left vertebral artery is normal. CTA HEAD FINDINGS POSTERIOR CIRCULATION: --Vertebral arteries: Both V4 segments are opacified. --Posterior inferior cerebellar arteries (PICA): Patent origins from the vertebral arteries. --Anterior inferior cerebellar arteries (AICA): Patent origins from the basilar artery. --Basilar artery: Normal. --Superior cerebellar arteries: Normal. --Posterior cerebral arteries: Normal. Both originate from the basilar artery. Posterior communicating arteries (p-comm) are diminutive or absent. ANTERIOR CIRCULATION: --Intracranial internal carotid  arteries: Atherosclerotic calcification of the internal carotid arteries at the skull base without hemodynamically significant stenosis. --Anterior cerebral arteries (ACA): Normal. Both A1 segments are present. Patent anterior communicating artery (a-comm). --Middle cerebral arteries (MCA): Normal. VENOUS SINUSES: As permitted by contrast timing, patent. ANATOMIC VARIANTS: None Review of the MIP images confirms the above findings. IMPRESSION: 1. No intracranial arterial occlusion or high-grade stenosis. 2. Occlusion of the right vertebral artery V3 segment, which was occluded on the MRA of 10/02/2012 3. Aortic Atherosclerosis (ICD10-I70.0). Electronically Signed   By: Deatra Robinson M.D.   On: 02/09/2019 02:50   Dg Chest 2 View  Result Date: 02/09/2019 CLINICAL DATA:  Weakness EXAM: CHEST - 2 VIEW COMPARISON:  05/27/2008 FINDINGS: No pleural effusion. Diffuse coarse interstitial opacity with mild reticulation suggesting component of chronic interstitial lung disease. Streaky opacities in the upper lobes and left base. Normal cardiomediastinal silhouette. No pneumothorax. IMPRESSION: 1. Diffuse coarse and mildly reticular interstitial opacity, suspect for component of chronic interstitial lung disease. Streaky opacities are present within the right greater than left upper lobe and the left lung base and may reflect acute superimposed pneumonia. Electronically Signed   By: Jasmine Pang M.D.   On: 02/09/2019 01:17   Ct Head Wo Contrast  Result Date: 02/09/2019 CLINICAL DATA:  Vertigo, episodic EXAM: CT HEAD WITHOUT CONTRAST TECHNIQUE: Contiguous axial images were obtained from the base of the skull through the vertex without intravenous contrast. COMPARISON:  None. FINDINGS: Brain: No evidence of acute territorial infarction, hemorrhage, hydrocephalus,extra-axial collection or mass lesion/mass effect. There is dilatation the ventricles and sulci consistent with age-related atrophy. Low-attenuation changes in  the deep white matter consistent with small vessel ischemia. Vascular: No hyperdense vessel or unexpected calcification. Skull: The skull is intact. No fracture or focal lesion identified. Sinuses/Orbits: The visualized paranasal sinuses and mastoid air cells are clear. The orbits and globes intact. Other: None IMPRESSION: No acute intracranial abnormality. Findings consistent with age related atrophy and chronic small vessel ischemia Electronically Signed   By: Jonna Clark M.D.   On: 02/09/2019 01:03   Ct Angio Neck W And/or Wo Contrast  Result Date: 02/09/2019 CLINICAL DATA:  dizziness and weakness EXAM: CT ANGIOGRAPHY HEAD AND NECK TECHNIQUE: Multidetector CT imaging of the head and neck was performed using the standard protocol during  bolus administration of intravenous contrast. Multiplanar CT image reconstructions and MIPs were obtained to evaluate the vascular anatomy. Carotid stenosis measurements (when applicable) are obtained utilizing NASCET criteria, using the distal internal carotid diameter as the denominator. CONTRAST:  169mL OMNIPAQUE IOHEXOL 350 MG/ML SOLN COMPARISON:  MRA 10/02/2012. FINDINGS: CTA NECK FINDINGS SKELETON: There is no bony spinal canal stenosis. No lytic or blastic lesion. OTHER NECK: Normal pharynx, larynx and major salivary glands. No cervical lymphadenopathy. Unremarkable thyroid gland. UPPER CHEST: No pneumothorax or pleural effusion. No nodules or masses. AORTIC ARCH: There is mild calcific atherosclerosis of the aortic arch. There is no aneurysm, dissection or hemodynamically significant stenosis of the visualized portion of the aorta. Conventional 3 vessel aortic branching pattern. The visualized proximal subclavian arteries are widely patent. RIGHT CAROTID SYSTEM: No dissection, occlusion or aneurysm. Mild atherosclerotic calcification at the carotid bifurcation without hemodynamically significant stenosis. LEFT CAROTID SYSTEM: No dissection, occlusion or aneurysm. Mild  atherosclerotic calcification at the carotid bifurcation without hemodynamically significant stenosis. VERTEBRAL ARTERIES: Left dominant configuration. Both origins are clearly patent. The right vertebral artery V3 segment is occluded, as it was on 10/02/2012. The left vertebral artery is normal. CTA HEAD FINDINGS POSTERIOR CIRCULATION: --Vertebral arteries: Both V4 segments are opacified. --Posterior inferior cerebellar arteries (PICA): Patent origins from the vertebral arteries. --Anterior inferior cerebellar arteries (AICA): Patent origins from the basilar artery. --Basilar artery: Normal. --Superior cerebellar arteries: Normal. --Posterior cerebral arteries: Normal. Both originate from the basilar artery. Posterior communicating arteries (p-comm) are diminutive or absent. ANTERIOR CIRCULATION: --Intracranial internal carotid arteries: Atherosclerotic calcification of the internal carotid arteries at the skull base without hemodynamically significant stenosis. --Anterior cerebral arteries (ACA): Normal. Both A1 segments are present. Patent anterior communicating artery (a-comm). --Middle cerebral arteries (MCA): Normal. VENOUS SINUSES: As permitted by contrast timing, patent. ANATOMIC VARIANTS: None Review of the MIP images confirms the above findings. IMPRESSION: 1. No intracranial arterial occlusion or high-grade stenosis. 2. Occlusion of the right vertebral artery V3 segment, which was occluded on the MRA of 10/02/2012 3. Aortic Atherosclerosis (ICD10-I70.0). Electronically Signed   By: Ulyses Jarred M.D.   On: 02/09/2019 02:50    EKG: Independently reviewed.  Assessment/Plan Principal Problem:   Stroke-like symptoms Active Problems:   NSTEMI (non-ST elevated myocardial infarction) (HCC)   GERD   Lower extremity edema   Hyperlipidemia  Stroke-like symptoms : Presented with dizziness and diplopia and acute weakness of the left leg, was evaluated with CT angio  scan of the head and neck which was  impressive of No intracranial arterial occlusion or high-grade stenosis. 2. Occlusion of the right vertebral artery V3 segment, which was occluded on the MRA of 10/02/2012, patient symptoms markedly improved in the ER but not considered candidate for TPA as her symptoms were more than 24 hours duration  -Start the patient on Plavix 75 mg daily as per neurology recommendation -MRI and echo in the morning(will need MRI if MRI is positive for stroke) -Fall precautions aspiration precaution in place, speech and swallow eval, PT OT eval post MRI -Follow lipid profile, TSH and hemoglobin A1c. -Neurology to follow the pt in the morning.   NSTEMI (non-ST elevated myocardial infarction) : Presented with vague symptoms of dizziness no complaint of chest tightness or chest pain no shortness of breath.  EKG with sinus rhythm at 81 low voltage QRS, no ST elevation or T wave inversion.  Was found to have elevated troponins [phone], no prior history of CAD.  Cardiology was consulted for further recommendation.  Their impression was to treat the patient for NSTEMI with heparin drip.  But as as per neurology Heparin is to be held till MRI of the brain has resulted.  -As there is no acute elevation of troponin second set has plateaued, the insult likely recent remote.  Will evaluate the patient with echo in the morning -Initiate heparin drip once MRI is resulted -Consult cardiology for further recommendation(was consulted from the ED over the phone official consult pending)   GERD : Patient asymptomatic is on PPIs continue the same    Lower extremity edema : Persistent lower pitting edema, 2-3+ on examination patient is on Lasix 40 mg daily and metolazone 50 mg every other day.  Continued both   Hyperlipidemia : Patient is on pravastatin 40 mg daily continue with the same  DVT prophylaxis: SCD  Code Status: DNR  Family Communication: Grand daughther by bed side  Disposition Plan: Unknown at this time    Consults called: Neurology tele Dr. Alice Reichert   Admission status: SDU   I certify that at the point of admission it is my clinical judgment that the patient will require inpatient hospital care spanning beyond 2 midnights from the point of admission due to high intensity of service, high risk for further deterioration and high frequency of surveillance required. The following factors support the patient status of inpatient:    Kirstie Peri MD [facility]  02/09/2019, 3:29 AM

## 2019-02-09 NOTE — Progress Notes (Signed)
Patient incontinent of large liquid stool. Dr. Denton Brick made aware, this is her 3rd liquid stool today.

## 2019-02-09 NOTE — Consult Note (Signed)
[facility] Tele[facility]urology Consult Services  Stat Consult  Date of Service:   02/09/2019 00:53:40  Impression:     .  Rule Out Acute Ischemic Stroke     .  vs neurological deficit due to systemic condition  Comments/Sign-Out: Patient with history of recent UTI (still on antibiotic), left leg weakness, Presents with few weeks of dizziness and intermittent double vision. Few days of weakness, and increased weakness today Head CT: No acute Intracranial hemorrhage. Symptoms/presentation is concerning for posterior circulation stroke. Cannot rule out neurological deficits due to systemic condition such as MI. Last time known well>24 hours therefore not a candidate for Thrombolysis or Endovascular treatment.  CT HEAD: Reviewed  Metrics: [facility] Notification Time: 02/09/2019 00:50:59 Stamp Time: 02/09/2019 00:53:40 Callback Response Time: 02/09/2019 00:52:04 Video Start Time: 02/09/2019 01:20:00 Video End Time: 02/09/2019 01:34:20  Our recommendations are outlined below.  Recommendations:     .  Initiate Plavix 75 MG Daily     .  cardiac workup per primary team     .  hold off on heparin drip till brain MRI is done (and confirms or rules out stroke)     .  she is allergic to aspirin, please start plavix if no contraindication.     .  Noncontrast Brain MRI, if it shows Acute Ischemic Stroke then consider head and neck MRA and ECHO, lipid panel and HbA1c  Imaging Studies:     .  MRI Head Without Contrast  Therapies:     .  Physical Therapy, Occupational Therapy, Speech Therapy Assessment When Applicable  Disposition: Neurology Follow Up Recommended  Sign Out:     .  Discussed with Emergency Department Provider  ----------------------------------------------------------------------------------------------------  Chief Complaint: not feeling right  History of Present Illness: Patient is a 85 year old Female.  Patient with history of recent UTI  (still on antibiotic), left leg weakness, In the past few days has been feeling week. Has been feeling dizzy for the past few weeks. Has been having double vision for few weeks. Today developed more significant leg weakness. Anticoagulation or Antiplatelet use: no Premorbid Level of function: Independent, Normal cognition, Speech and gait Chart reviewed for Pertinent medical, surgical, family history. Chart reviewed for Medications list and allergies. Pertinent positive and negative review of systems noted in History of Present Illness.   Past Medical History:     . There is NO history of Diabetes Mellitus     . There is NO history of Atrial Fibrillation  Anticoagulant use:  No  Examination: BP(144/103), Pulse(113), Blood Glucose(137)  Neuro Exam:  General: Alert,Awake, Oriented to Time, Place, Person  Speech: Fluent:  Language: Intact:  Face: Symmetric:  Facial Sensation:  Visual Fields:  Extraocular Movements: Intact:  Motor Exam: No Drift: RUE, LUE  Sensation:  Coordination: Intact: RUE, LUE  has chronic left leg weakness.   Due to the immediate potential for life-threatening deterioration due to underlying acute neurologic illness, I spent 14 minutes providing critical care. This time includes time for face to face visit via telemedicine, review of medical records, imaging studies and discussion of findings with providers, the patient and/or family.   Dr Elenor Quinones   [facility] [phone]   Case [id number]

## 2019-02-09 NOTE — Progress Notes (Signed)
   Linda Donaldson is  a  85 y.o.  Female who is AAO x 3 without significant depressive symptoms and without delusion/psychosis who is able to understand (without significant language barrier) her current medical diagnosis, patient also verbalizes understanding of proposed treatment options including option of no treatment, the consequences/risk versus benefit of each treatment option, alternatives as well as the option of no treatment. Based on my evaluation Linda Donaldson  appears to have the Capacity to make decisions and give informed consent about her medical care.  No surrogate decision-maker is required as Linda Donaldson  appears to have capacity to make her own decisions and give informed consent regarding her medical care -Linda Donaldson may appoint her own HCPOA as she desires and delegate duties to others as she desires  Linda Hockey, MD

## 2019-02-09 NOTE — Evaluation (Signed)
Clinical/Bedside Swallow Evaluation Patient Details  Name: Linda Donaldson MRN: [id number] Date of Birth: 1922-07-15  Today's Date: 02/09/2019 Time: SLP Start Time (ACUTE ONLY): 2353 SLP Stop Time (ACUTE ONLY): 6144 SLP Time Calculation (min) (ACUTE ONLY): 29 min  Past Medical History:  Past Medical History:  Diagnosis Date  . Arthritis   . Barrett esophagus   . Brachiocephalic artery stenosis, right (HCC)    PER PT TOLD POSSIBLE PREVIOUS TIA DUE TO HX VISUAL DISTURBANCE  . Common carotid artery stenosis    RIGHT  . Complication of anesthesia    PONV  . Cystitis   . DDD (degenerative disc disease), lumbar   . Diverticulosis   . Fluid retention in legs   . GERD (gastroesophageal reflux disease)   . Glaucoma of both eyes   . H/O hiatal hernia   . Headache(784.0)   . History of esophageal dilatation    2007  . History of kidney stones   . Left inguinal hernia   . Nocturia   . Occlusion of right vertebral artery without cerebral infarction    NEUROLOGIST-  DR PENUMALLI ([facility])  . Urgency of urination   . Wears glasses    Past Surgical History:  Past Surgical History:  Procedure Laterality Date  . APPENDECTOMY  1945  . CATARACT EXTRACTION W/ INTRAOCULAR LENS  IMPLANT, BILATERAL  2001  . CHOLECYSTECTOMY  1994  . CYSTOSCOPY WITH BIOPSY N/A 02/24/2013   Procedure: CYSTOSCOPY FULGERATION AND BLADDER BIOPSY;  Surgeon: Reece Packer, MD;  Location: [facility];  Service: Urology;  Laterality: N/A;  . DIRECT LARYNGOSCOPY  07-30-2008   W/ REMOVAL RIGHT VALLECULAR CYST  . REMOVAL EAR CANAL CYST  1968  . RIGHT OVARIAN CYSTECTOMY  1948  . TEMPORAL ARTERY BIOPSY / LIGATION Left 01-03-2009  . TONSILLECTOMY  1945  . TUBAL LIGATION     HPI:  85 year old woman ADL/IADL independent with past medical history is reviewed in the EMR was brought to the ER with complaint of generalized weakness and dizziness.  As per patient she has not been feeling well  throughout on 02/08/2019.  She states that yesterday evening around 6 PM she was walking with her walker when her legs gave way and she leaned against a TV cabinet and lowered herself down to the ground without hitting her head.  She had no loss of consciousness no bladder or bowel incontinence.  No blackout, any preceding dizziness or headache.  At the time of my interview patient stated that she has been having these symptoms for past 8 to 10 days which she describes as a feeling of unwell'' something passing through her body''.  She had no complaint of fever, chill, cough, congestion, nausea, vomiting, diarrhea, chest pain, shortness of breath, palpitation, headache and blurring of vision.  No tingling or numbness sensation at the time of interview.  As she complained of acute weakness in her left leg and was symptomatic for strokelike features code stroke was called. MRI shows: 1.4 x 0.8 cm acute right pontine infarct. BSE requested.   Assessment / Plan / Recommendation Clinical Impression  Clinical swallow evaluation completed at bedside. Pt presents with left facial weakness and droop otherwise oral motor structures appear WNL. Pt shows no signs of reduced airway protection, however does present with mild left buccal pocketing and lingual residuals post solid trials with reduced awareness of the same. Pt benefited from liquid wash and cue to swish and swallow to help clear lingual residuals. She  was able to return demonstrate on subsequent trials, however she will need supervision with meals. Recommend D3/mech soft textures with thin liquids via cup/straw and po medications whole with thin. SLP will follow during acute stay for dysphagia needs. She reportedly lives in a "senior apartment" and family assists as needed.  SLP Visit Diagnosis: Dysphagia, unspecified (R13.10)    Aspiration Risk  Mild aspiration risk    Diet Recommendation Dysphagia 3 (Mech soft);Thin liquid   Liquid Administration via:  Cup;Straw Medication Administration: Whole meds with liquid Supervision: Patient able to self feed;Full supervision/cueing for compensatory strategies Compensations: Lingual sweep for clearance of pocketing;Small sips/bites Postural Changes: Seated upright at 90 degrees;Remain upright for at least 30 minutes after po intake    Other  Recommendations Oral Care Recommendations: Oral care BID;Staff/trained caregiver to provide oral care Other Recommendations: Clarify dietary restrictions   Follow up Recommendations Skilled Nursing facility(pending)      Frequency and Duration min 2x/week  1 week       Prognosis Prognosis for Safe Diet Advancement: Good      Swallow Study   General Date of Onset: 02/08/19 HPI: 85 year old woman ADL/IADL independent with past medical history is reviewed in the EMR was brought to the ER with complaint of generalized weakness and dizziness.  As per patient she has not been feeling well throughout on 02/08/2019.  She states that yesterday evening around 6 PM she was walking with her walker when her legs gave way and she leaned against a TV cabinet and lowered herself down to the ground without hitting her head.  She had no loss of consciousness no bladder or bowel incontinence.  No blackout, any preceding dizziness or headache.  At the time of my interview patient stated that she has been having these symptoms for past 8 to 10 days which she describes as a feeling of unwell'' something passing through her body''.  She had no complaint of fever, chill, cough, congestion, nausea, vomiting, diarrhea, chest pain, shortness of breath, palpitation, headache and blurring of vision.  No tingling or numbness sensation at the time of interview.  As she complained of acute weakness in her left leg and was symptomatic for strokelike features code stroke was called. MRI shows: 1.4 x 0.8 cm acute right pontine infarct. BSE requested. Type of Study: Bedside Swallow  Evaluation Previous Swallow Assessment: None on record Diet Prior to this Study: Regular;Thin liquids Temperature Spikes Noted: No Respiratory Status: Room air History of Recent Intubation: No Behavior/Cognition: Alert;Cooperative;Pleasant mood Oral Cavity Assessment: Within Functional Limits Oral Care Completed by SLP: Yes Oral Cavity - Dentition: Adequate natural dentition;Missing dentition Vision: Functional for self-feeding Self-Feeding Abilities: Able to feed self Patient Positioning: Upright in bed Baseline Vocal Quality: Normal Volitional Cough: Strong Volitional Swallow: Able to elicit    Oral/Motor/Sensory Function Overall Oral Motor/Sensory Function: Mild impairment Facial ROM: Reduced left;Suspected CN VII (facial) dysfunction Facial Symmetry: Abnormal symmetry left;Suspected CN VII (facial) dysfunction Facial Strength: Suspected CN VII (facial) dysfunction;Reduced left Lingual ROM: Within Functional Limits Lingual Symmetry: Within Functional Limits Lingual Strength: Within Functional Limits Lingual Sensation: Reduced;Suspected CN VII (facial) dysfunction-anterior 2/3 tongue Velum: Within Functional Limits Mandible: Within Functional Limits   Ice Chips Ice chips: Within functional limits Presentation: Spoon   Thin Liquid Thin Liquid: Within functional limits Presentation: Cup;Self Fed;Straw    Nectar Thick Nectar Thick Liquid: Not tested   Honey Thick Honey Thick Liquid: Not tested   Puree Puree: Within functional limits Presentation: Spoon   Solid  Solid: Impaired Presentation: Self Fed;Spoon Oral Phase Functional Implications: Oral residue     Thank you,  Havery Moros, CCC-SLP [phone]  Dionel Archey 02/09/2019,4:14 PM

## 2019-02-09 NOTE — Progress Notes (Signed)
  Patient seen and evaluated, chart reviewed, please see EMR for updated orders. Please see full H&P dictated by admitting physician Dr. Dwyane Dee for same date of service.   85 year old woman ADL/IADL independent admitted from independent living apartment with strokelike symptoms and elevated troponin  1)1.4 x 0.8 cm acute right pontine infarct.- MRI brain confirms acute pontine stroke, CT head and CTA neck without LVO -Echocardiogram pending -Official in-house neurology consult pending -Speech and physical therapy eval appreciated -LDL 74 with HDL of 54,, A1c is 5.5 and TSH is 2.0 -PTA patient was on pravastatin 40 mg daily, changed to Lipitor 80 mg daily Even if his lipid panel is within desired limits, patient should still take Lipitor/Statin for it's Pleiotropic effects (beyond cholesterol lowering benefits) -PTA patient was not on antiplatelet agents (intolerant of aspirin treat empirically with Plavix 75 mg daily   2) elevated troponin in the setting of acute stroke--- ?? NSTEMI-await echocardiogram to evaluate EF and to rule out significant wall motion normalities -Troponin 2547 previous level 2559 -Give metoprolol 12.5 mg twice daily, Lipitor as above, Plavix as above -Chest pain-free  3) nausea vomiting diarrhea--- multiple episodes of loose stools, check stool for C. difficile and GI pathogen Zofran PRN nausea vomiting -Hold Lasix due to concerns about fluid losses and risk for dehydration  Roxan Hockey, MD

## 2019-02-09 NOTE — Consult Note (Signed)
[facility] Linda Donaldson A. Gerilyn Pilgrim, MD     [email or web address]          Linda Donaldson is an 85 y.o. female.   ASSESSMENT/PLAN: 1.  Acute right pontine infarct presenting as acute gait ataxia, global weakness and a left hemiparesis.  This is most likely small vessel lacunar event.  Risk factors age and previous history of intracranial occlusive disease.  The patient has been placed on Plavix.  She is on a statin.  Physical therapy is recommended.  The patient is a 85 year old white female who presents with the acute onset of gait ataxia, global weakness and dizziness.  She was not a candidate for thrombolysis because of the onset been over 24 hours.  She in fact has not felt well for the last couple weeks.  She reports also having episodes of a spinning-like sensation.  The patient has had diarrhea for about a month.  She does not report difficulty swallowing but reports that her speech has not been as fluid that usually is although she does not clearly report dysarthria.  She does not specifically reports focal numbness or weakness but she was noted to have left leg weakness on initial presentation in the emergency room.  The patient previously has had right vertebral artery occlusion in 2014.  It is unclear what symptoms he had at that time.  She denies chest pain or palpitation or shortness of breath.  She does report feeling fatigued and drowsy.  The review of systems otherwise negative.  GENERAL: The patient is laying in bed but easily arousable.  She is in no acute distress.  HEENT: This is normal.  ABDOMEN: soft  EXTREMITIES: No edema   BACK: Normal  SKIN: Normal by inspection.    MENTAL STATUS: She opens her eyes to verbal commands.  She is lucid and coherent.  She is oriented to the month and her age.  There appears to be solid dysarthria.  CRANIAL NERVES: Pupils are equal, round and reactive to light and accomodation; extra ocular movements are full, there is no  significant nystagmus; visual fields are full; upper and lower facial muscles are normal in strength and symmetric, there is mild flattening of the nasolabial fold - L; tongue is midline; uvula is midline; shoulder elevation is normal.   MOTOR: The deltoids are 4+/5.  There is a significant drift of the left upper extremity.  There is no drift on the right.  Right hip flexion is 4+ and dorsiflexion 4+.  Left hip flexion is 2/5.  COORDINATION: Left finger to nose is normal, right finger to nose is normal, No rest tremor; no intention tremor; no postural tremor; no bradykinesia.  REFLEXES: Deep tendon reflexes are symmetrical and normal.   SENSATION: Normal to light touch, temperature, and pain.   NIH stroke scale 1, 1, 2, 3 total 7.    Blood pressure 121/73, pulse 84, temperature 97.7 F (36.5 C), temperature source Oral, resp. rate (!) 25, height  (1.6 m), weight 81 kg, SpO2 96 %.  Past Medical History:  Diagnosis Date  . Arthritis   . Barrett esophagus   . Brachiocephalic artery stenosis, right (HCC)    PER PT TOLD POSSIBLE PREVIOUS TIA DUE TO HX VISUAL DISTURBANCE  . Common carotid artery stenosis    RIGHT  . Complication of anesthesia    PONV  . Cystitis   . DDD (degenerative disc disease), lumbar   . Diverticulosis   . Fluid retention in legs   .  GERD (gastroesophageal reflux disease)   . Glaucoma of both eyes   . H/O hiatal hernia   . Headache(784.0)   . History of esophageal dilatation    2007  . History of kidney stones   . Left inguinal hernia   . Nocturia   . Occlusion of right vertebral artery without cerebral infarction    NEUROLOGIST-  DR PENUMALLI ([facility])  . Urgency of urination   . Wears glasses     Past Surgical History:  Procedure Laterality Date  . APPENDECTOMY  1945  . CATARACT EXTRACTION W/ INTRAOCULAR LENS  IMPLANT, BILATERAL  2001  . CHOLECYSTECTOMY  1994  . CYSTOSCOPY WITH BIOPSY N/A 02/24/2013   Procedure: CYSTOSCOPY FULGERATION  AND BLADDER BIOPSY;  Surgeon: Martina Sinner, MD;  Location: [facility];  Service: Urology;  Laterality: N/A;  . DIRECT LARYNGOSCOPY  07-30-2008   W/ REMOVAL RIGHT VALLECULAR CYST  . REMOVAL EAR CANAL CYST  1968  . RIGHT OVARIAN CYSTECTOMY  1948  . TEMPORAL ARTERY BIOPSY / LIGATION Left 01-03-2009  . TONSILLECTOMY  1945  . TUBAL LIGATION      Family History  Problem Relation Age of Onset  . Cancer Mother     Social History:  reports that she quit smoking about 67 years ago. Her smoking use included cigarettes. She quit after 15.00 years of use. She has never used smokeless tobacco. She reports that she does not drink alcohol or use drugs.  Allergies:  Allergies  Allergen Reactions  . Aspirin Hives  . Penicillins Hives  . Ciprofloxacin Rash    Medications: Prior to Admission medications   Medication Sig Start Date End Date Taking? Authorizing Provider  alendronate (FOSAMAX) 70 MG tablet Take 70 mg by mouth every 7 (seven) days. Take with a full glass of water on an empty stomach.   Yes [provider]  bimatoprost (LUMIGAN) 0.01 % SOLN Place 1 drop into both eyes at bedtime.    Yes [provider]  Calcium Carb-Cholecalciferol (CALCIUM 600/VITAMIN D3) 600-800 MG-UNIT TABS Take 1 tablet by mouth daily.   Yes [provider]  dorzolamide-timolol (COSOPT) 22.3-6.8 MG/ML ophthalmic solution Place 1 drop into both eyes 2 (two) times daily.   Yes [provider]  fluticasone (FLONASE) 50 MCG/ACT nasal spray Place 2 sprays into the nose as needed.    Yes [provider]  furosemide (LASIX) 40 MG tablet Take 40 mg by mouth daily. ALTERNATES W/ ZAROXOLYN EVERY OTHER DAY   Yes [provider]  omeprazole (PRILOSEC) 20 MG capsule Take 20 mg by mouth 2 (two) times daily.    Yes [provider]  pravastatin (PRAVACHOL) 40 MG tablet Take 40 mg by mouth daily.   Yes [provider]  trimethoprim (TRIMPEX)  100 MG tablet Take 100 mg by mouth daily.   Yes [provider]  acetaminophen (TYLENOL) 325 MG tablet Take 650 mg by mouth every 6 (six) hours as needed for pain.    [provider]  cyclobenzaprine (FLEXERIL) 10 MG tablet Take 10 mg by mouth at bedtime as needed for muscle spasms.    [provider]  HYDROcodone-acetaminophen (NORCO) 5-325 MG per tablet Take 1 tablet by mouth every 6 (six) hours as needed for pain. 02/24/13   Alfredo Martinez, MD  Meth-Hyo-M Salley Hews Phos-Ph Sal (URIBEL) 118 MG CAPS Take 1 capsule by mouth 4 (four) times daily as needed.    [provider]  metolazone (ZAROXOLYN) 5 MG tablet  Take 15 mg by mouth every other day. ALTERNATES WITH LASIX EVERY OTHER DATE    [provider]  potassium chloride (K-DUR) 10 MEQ tablet Take 10-20 mEq by mouth 2 (two) times daily.     [provider]  traMADol (ULTRAM) 50 MG tablet Take 50 mg by mouth every 6 (six) hours as needed for pain.    [provider]  triamcinolone cream (KENALOG) 0.1 % Apply 1 application topically as needed.    [provider]  trolamine salicylate (ASPERCREME) 10 % cream Apply 1 application topically as needed.     [provider]    Scheduled Meds: . atorvastatin  80 mg Oral q1800  . Chlorhexidine Gluconate Cloth  6 each Topical Daily  . clopidogrel  75 mg Oral Daily  . dorzolamide-timolol  1 drop Both Eyes BID  . feeding supplement (ENSURE ENLIVE)  237 mL Oral BID BM  . fluticasone  2 spray Each Nare Daily  . [START ON 02/10/2019] influenza vaccine adjuvanted  0.5 mL Intramuscular Tomorrow-1000  . latanoprost  1 drop Both Eyes QHS  . metoprolol tartrate  12.5 mg Oral BID  . pantoprazole  40 mg Oral Daily  . potassium chloride  10 mEq Oral BID  . trimethoprim  100 mg Oral Daily   Continuous Infusions: PRN Meds:.ondansetron (ZOFRAN) IV, perflutren lipid microspheres (DEFINITY) IV suspension     Results for orders placed  or performed during the hospital encounter of 02/08/19 (from the past 48 hour(s))  CBC with Differential/Platelet     Status: None   Collection Time: 02/08/19 11:30 PM  Result Value Ref Range   WBC 8.8 4.0 - 10.5 K/uL   RBC 4.48 3.87 - 5.11 MIL/uL   Hemoglobin 13.8 12.0 - 15.0 g/dL   HCT 11.9 14.7 - 82.9 %   MCV 96.0 80.0 - 100.0 fL   MCH 30.8 26.0 - 34.0 pg   MCHC 32.1 30.0 - 36.0 g/dL   RDW 56.2 13.0 - 86.5 %   Platelets 213 150 - 400 K/uL   nRBC 0.0 0.0 - 0.2 %   Neutrophils Relative % 66 %   Neutro Abs 5.8 1.7 - 7.7 K/uL   Lymphocytes Relative 22 %   Lymphs Abs 1.9 0.7 - 4.0 K/uL   Monocytes Relative 10 %   Monocytes Absolute 0.9 0.1 - 1.0 K/uL   Eosinophils Relative 2 %   Eosinophils Absolute 0.2 0.0 - 0.5 K/uL   Basophils Relative 0 %   Basophils Absolute 0.0 0.0 - 0.1 K/uL   Immature Granulocytes 0 %   Abs Immature Granulocytes 0.02 0.00 - 0.07 K/uL    Comment: Performed at [facility], [address] [place], [place] [address]  Comprehensive metabolic panel     Status: Abnormal   Collection Time: 02/08/19 11:30 PM  Result Value Ref Range   Sodium 136 135 - 145 mmol/L   Potassium 3.9 3.5 - 5.1 mmol/L   Chloride 105 98 - 111 mmol/L   CO2 22 22 - 32 mmol/L   Glucose, Bld 137 (H) 70 - 99 mg/dL   BUN 21 8 - 23 mg/dL   Creatinine, Ser 6.29 0.44 - 1.00 mg/dL   Calcium 9.9 8.9 - 52.8 mg/dL   Total Protein 7.2 6.5 - 8.1 g/dL   Albumin 3.8 3.5 - 5.0 g/dL   AST 22 15 - 41 U/L   ALT 9 0 - 44 U/L   Alkaline Phosphatase 49 38 - 126 U/L  Total Bilirubin 0.4 0.3 - 1.2 mg/dL   GFR calc non Af Amer 51 (L) >60 mL/min   GFR calc Af Amer 59 (L) >60 mL/min   Anion gap 9 5 - 15    Comment: Performed at [facility], [address] [place], [place] [address]  Troponin I (High Sensitivity)     Status: Abnormal   Collection Time: 02/08/19 11:30 PM  Result Value Ref Range   Troponin I (High Sensitivity) 2,559 (HH) <18 ng/L    Comment: CRITICAL RESULT CALLED TO, READ BACK BY AND  VERIFIED WITH: CRABTREE,B @ 0029 ON 02/09/19 BY JUW Performed at [facility], [address], [place], [place] [address]   Brain natriuretic peptide     Status: Abnormal   Collection Time: 02/08/19 11:30 PM  Result Value Ref Range   B Natriuretic Peptide 176.0 (H) 0.0 - 100.0 pg/mL    Comment: Performed at [facility], [address] [place], [place] [address]  Lactic acid, plasma     Status: None   Collection Time: 02/08/19 11:30 PM  Result Value Ref Range   Lactic Acid, Venous 1.4 0.5 - 1.9 mmol/L    Comment: Performed at [facility], [address] [place], [place] [address]  Lactic acid, plasma     Status: None   Collection Time: 02/09/19 12:23 AM  Result Value Ref Range   Lactic Acid, Venous 1.2 0.5 - 1.9 mmol/L    Comment: Performed at [facility], [address], [place], [place] [address]  Blood culture (routine x 2)     Status: None (Preliminary result)   Collection Time: 02/09/19 12:23 AM   Specimen: BLOOD LEFT FOREARM  Result Value Ref Range   Specimen Description BLOOD LEFT FOREARM    Special Requests      BOTTLES DRAWN AEROBIC AND ANAEROBIC Blood Culture adequate volume   Culture      NO GROWTH < 12 HOURS Performed at [facility], [address], [place], [place] [address]    Report Status PENDING   Troponin I (High Sensitivity)     Status: Abnormal   Collection Time: 02/09/19 12:23 AM  Result Value Ref Range   Troponin I (High Sensitivity) 2,547 (HH) <18 ng/L    Comment: CRITICAL VALUE NOTED.  VALUE IS CONSISTENT WITH PREVIOUSLY REPORTED AND CALLED VALUE. Performed at [facility], [address] [place], [place] [address]   Blood culture (routine x 2)     Status: None (Preliminary result)   Collection Time: 02/09/19 12:35 AM   Specimen: BLOOD RIGHT HAND  Result Value Ref Range   Specimen Description BLOOD RIGHT HAND    Special Requests      BOTTLES DRAWN AEROBIC AND ANAEROBIC Blood Culture adequate volume   Culture      NO GROWTH < 12 HOURS Performed at  [facility], [address], [place], [place] [address]    Report Status PENDING   SARS CORONAVIRUS 2 (TAT 6-24 HRS) Nasopharyngeal Nasopharyngeal Swab     Status: None   Collection Time: 02/09/19  4:19 AM   Specimen: Nasopharyngeal Swab  Result Value Ref Range   SARS Coronavirus 2 NEGATIVE NEGATIVE    Comment: (NOTE) SARS-CoV-2 target nucleic acids are NOT DETECTED. The SARS-CoV-2 RNA is generally detectable in upper and lower respiratory specimens during the acute phase of infection. Negative results do not preclude SARS-CoV-2 infection, do not rule out co-infections with other pathogens, and should not be used as the sole basis for treatment or other  patient management decisions. Negative results must be combined with clinical observations, patient history, and epidemiological information. The expected result is Negative. Fact Sheet for Patients: HairSlick.no Fact Sheet for Healthcare Providers: quierodirigir.com This test is not yet approved or cleared by the [place] FDA and  has been authorized for detection and/or diagnosis of SARS-CoV-2 by FDA under an Emergency Use Authorization (EUA). This EUA will remain  in effect (meaning this test can be used) for the duration of the COVID-19 declaration under Section 56 4(b)(1) of the Act, 21 U.S.C. section 360bbb-3(b)(1), unless the authorization is terminated or revoked sooner. Performed at [facility], [address], [place], [place] [address]   MRSA PCR Screening     Status: None   Collection Time: 02/09/19  5:07 AM   Specimen: Nasal Mucosa; Nasopharyngeal  Result Value Ref Range   MRSA by PCR NEGATIVE NEGATIVE    Comment:        The GeneXpert MRSA Assay (FDA approved for NASAL specimens only), is one component of a comprehensive MRSA colonization surveillance program. It is not intended to diagnose MRSA infection nor to guide or monitor treatment for  MRSA infections. Performed at [facility], [address], [place], [place] [address]   Hemoglobin A1c     Status: None   Collection Time: 02/09/19  5:09 AM  Result Value Ref Range   Hgb A1c MFr Bld 5.5 4.8 - 5.6 %    Comment: (NOTE) Pre diabetes:          5.7%-6.4% Diabetes:              >6.4% Glycemic control for   <7.0% adults with diabetes    Mean Plasma Glucose 111.15 mg/dL    Comment: Performed at [facility], [address] [place], [place] [address]  TSH     Status: None   Collection Time: 02/09/19  6:59 AM  Result Value Ref Range   TSH 2.043 0.350 - 4.500 uIU/mL    Comment: Performed by a 3rd Generation assay with a functional sensitivity of <=0.01 uIU/mL. Performed at [facility], [address], [place], [place] [address]   Lipid panel     Status: None   Collection Time: 02/09/19  6:59 AM  Result Value Ref Range   Cholesterol 151 0 - 200 mg/dL   Triglycerides 295 <621 mg/dL   HDL 54 >30 mg/dL   Total CHOL/HDL Ratio 2.8 RATIO   VLDL 23 0 - 40 mg/dL   LDL Cholesterol 74 0 - 99 mg/dL    Comment:        Total Cholesterol/HDL:CHD Risk Coronary Heart Disease Risk Table                     Men   Women  1/2 Average Risk   3.4   3.3  Average Risk       5.0   4.4  2 X Average Risk   9.6   7.1  3 X Average Risk  23.4   11.0        Use the calculated Patient Ratio above and the CHD Risk Table to determine the patient's CHD Risk.        ATP III CLASSIFICATION (LDL):  <100     mg/dL   Optimal  865-784  mg/dL   Near or Above                    Optimal  130-159  mg/dL   Borderline  161-096  mg/dL   High  >045     mg/dL   Very High Performed at [facility], [address] [place], [place] [address]     Studies/Results:  HEAD NECK CTA FINDINGS: CTA NECK FINDINGS  SKELETON: There is no bony spinal canal stenosis. No lytic or blastic lesion.  OTHER NECK: Normal pharynx, larynx and major salivary glands. No cervical lymphadenopathy. Unremarkable  thyroid gland.  UPPER CHEST: No pneumothorax or pleural effusion. No nodules or masses.  AORTIC ARCH:  There is mild calcific atherosclerosis of the aortic arch. There is no aneurysm, dissection or hemodynamically significant stenosis of the visualized portion of the aorta. Conventional 3 vessel aortic branching pattern. The visualized proximal subclavian arteries are widely patent.  RIGHT CAROTID SYSTEM: No dissection, occlusion or aneurysm. Mild atherosclerotic calcification at the carotid bifurcation without hemodynamically significant stenosis.  LEFT CAROTID SYSTEM: No dissection, occlusion or aneurysm. Mild atherosclerotic calcification at the carotid bifurcation without hemodynamically significant stenosis.  VERTEBRAL ARTERIES: Left dominant configuration. Both origins are clearly patent. The right vertebral artery V3 segment is occluded, as it was on 10/02/2012. The left vertebral artery is normal.  CTA HEAD FINDINGS  POSTERIOR CIRCULATION:  --Vertebral arteries: Both V4 segments are opacified.  --Posterior inferior cerebellar arteries (PICA): Patent origins from the vertebral arteries.  --Anterior inferior cerebellar arteries (AICA): Patent origins from the basilar artery.  --Basilar artery: Normal.  --Superior cerebellar arteries: Normal.  --Posterior cerebral arteries: Normal. Both originate from the basilar artery. Posterior communicating arteries (p-comm) are diminutive or absent.  ANTERIOR CIRCULATION:  --Intracranial internal carotid arteries: Atherosclerotic calcification of the internal carotid arteries at the skull base without hemodynamically significant stenosis.  --Anterior cerebral arteries (ACA): Normal. Both A1 segments are present. Patent anterior communicating artery (a-comm).  --Middle cerebral arteries (MCA): Normal.  VENOUS SINUSES: As permitted by contrast timing, patent.  ANATOMIC VARIANTS: None  Review of  the MIP images confirms the above findings.  IMPRESSION: 1. No intracranial arterial occlusion or high-grade stenosis. 2. Occlusion of the right vertebral artery V3 segment, which was occluded on the MRA of 10/02/2012 3. Aortic Atherosclerosis (ICD10-I70.0).       BRAIN MRI FINDINGS: Brain:  Multiple sequences are motion degraded, limiting evaluation  There is a 1.4 x 0.8 cm focus of restricted diffusion within the right pons consistent with acute infarction. Corresponding T2/FLAIR hyperintensity at this site.  Advanced chronic small vessel ischemic disease with small bilateral chronic basal ganglia and thalamic lacunar infarcts, progressed from prior MRI 10/02/2012.  No evidence of intracranial mass. No midline shift or extra-axial fluid collection. No chronic intracranial blood products.  Mild generalized parenchymal atrophy.  No abnormal intracranial enhancement is identified.  Vascular: Flow voids maintained within the proximal large arterial vessels.  Skull and upper cervical spine: No focal marrow lesion  Sinuses/Orbits: Visualized orbits demonstrate no acute abnormality. Mild ethmoid sinus mucosal thickening. Visualized mastoid air cells are clear.  These results were called by telephone at the time of interpretation on 02/09/2019 at 1:19 pm to provider [facility] , who verbally acknowledged these results.  IMPRESSION: 1. Motion degraded examination. 2. 1.4 x 0.8 cm acute right pontine infarct. 3. Advanced chronic small vessel ischemic disease with bilateral chronic basal ganglia/thalamic lacunar infarcts, progressed from prior MRI 10/02/2012. 4. Mild generalized parenchymal atrophy.   The brain MRI scan is reviewed in person.  There is an acute infarct seen on DWI involving the lateral right pontine region.  It is moderate  in size with corresponding reduced signal on the ADC scan.  There is no evidence of hemorrhage appreciated.  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        Linda Donaldson A. Akshita Italiano, M.D.  DipGerilyn Pilgrimlomate, Biomedical engineerAmerican Board of Psychiatry and Neurology ( Neurology). 02/09/2019, 6:07 PM

## 2019-02-10 DIAGNOSIS — I214 Non-ST elevation (NSTEMI) myocardial infarction: Secondary | ICD-10-CM

## 2019-02-10 DIAGNOSIS — I429 Cardiomyopathy, unspecified: Secondary | ICD-10-CM

## 2019-02-10 DIAGNOSIS — I639 Cerebral infarction, unspecified: Secondary | ICD-10-CM

## 2019-02-10 DIAGNOSIS — R197 Diarrhea, unspecified: Secondary | ICD-10-CM

## 2019-02-10 LAB — COMPREHENSIVE METABOLIC PANEL
ALT: 13 U/L (ref 0–44)
AST: 29 U/L (ref 15–41)
Albumin: 3.3 g/dL — ABNORMAL LOW (ref 3.5–5.0)
Alkaline Phosphatase: 42 U/L (ref 38–126)
Anion gap: 9 (ref 5–15)
BUN: 20 mg/dL (ref 8–23)
CO2: 23 mmol/L (ref 22–32)
Calcium: 9.5 mg/dL (ref 8.9–10.3)
Chloride: 106 mmol/L (ref 98–111)
Creatinine, Ser: 0.99 mg/dL (ref 0.44–1.00)
GFR calc Af Amer: 57 mL/min — ABNORMAL LOW (ref >60)
GFR calc non Af Amer: 49 mL/min — ABNORMAL LOW (ref >60)
Glucose, Bld: 118 mg/dL — ABNORMAL HIGH (ref 70–99)
Potassium: 3.6 mmol/L (ref 3.5–5.1)
Sodium: 138 mmol/L (ref 135–145)
Total Bilirubin: 0.5 mg/dL (ref 0.3–1.2)
Total Protein: 6.5 g/dL (ref 6.5–8.1)

## 2019-02-10 LAB — CBC
HCT: 47.6 % — ABNORMAL HIGH (ref 36.0–46.0)
Hemoglobin: 14.7 g/dL (ref 12.0–15.0)
MCH: 30.6 pg (ref 26.0–34.0)
MCHC: 30.9 g/dL (ref 30.0–36.0)
MCV: 99 fL (ref 80.0–100.0)
Platelets: 196 10*3/uL (ref 150–400)
RBC: 4.81 MIL/uL (ref 3.87–5.11)
RDW: 13 % (ref 11.5–15.5)
WBC: 10.6 10*3/uL — ABNORMAL HIGH (ref 4.0–10.5)
nRBC: 0 % (ref 0.0–0.2)

## 2019-02-10 MED ORDER — METOPROLOL SUCCINATE ER 25 MG PO TB24
12.5000 mg | ORAL_TABLET | Freq: Two times a day (BID) | ORAL | Status: DC
  Administered 2019-02-10 – 2019-02-12 (×4): 12.5 mg via ORAL
  Filled 2019-02-10 (×5): qty 1

## 2019-02-10 MED ORDER — ADULT MULTIVITAMIN W/MINERALS CH
1.0000 | ORAL_TABLET | Freq: Every day | ORAL | Status: DC
  Administered 2019-02-10 – 2019-02-12 (×3): 1 via ORAL
  Filled 2019-02-10 (×3): qty 1

## 2019-02-10 MED ORDER — HEPARIN SODIUM (PORCINE) 5000 UNIT/ML IJ SOLN
5000.0000 [IU] | Freq: Three times a day (TID) | INTRAMUSCULAR | Status: DC
  Administered 2019-02-10 – 2019-02-12 (×7): 5000 [IU] via SUBCUTANEOUS
  Filled 2019-02-10 (×7): qty 1

## 2019-02-10 NOTE — Plan of Care (Signed)
  Problem: Acute Rehab OT Goals (only OT should resolve) Goal: Pt. Will Perform Grooming Flowsheets (Taken 02/10/2019 1454) Pt Will Perform Grooming:  with modified independence  sitting Goal: Pt. Will Perform Upper Body Bathing Flowsheets (Taken 02/10/2019 1454) Pt Will Perform Upper Body Bathing:  with set-up  with supervision  sitting Goal: Pt. Will Perform Upper Body Dressing Flowsheets (Taken 02/10/2019 1454) Pt Will Perform Upper Body Dressing:  with set-up  sitting Goal: Pt. Will Transfer To Toilet Flowsheets (Taken 02/10/2019 1454) Pt Will Transfer to Toilet:  with supervision  stand pivot transfer  bedside commode  ambulating Goal: Pt. Will Perform Toileting-Clothing Manipulation Flowsheets (Taken 02/10/2019 1454) Pt Will Perform Toileting - Clothing Manipulation and hygiene:  with min assist  sit to/from stand  sitting/lateral leans Goal: Pt/Caregiver Will Perform Home Exercise Program Flowsheets (Taken 02/10/2019 1454) Pt/caregiver will Perform Home Exercise Program:  Increased ROM  Increased strength  Left upper extremity  With Supervision  With written HEP provided

## 2019-02-10 NOTE — Progress Notes (Signed)
Nursing Home Choices   [facility] [address] [place], [place] [address] [phone]   Add [facility] My Favorites- Opens in a new window 5 out of 5 starsfootnote Much Above Average 5 out of 5 starsfootnote Much Above Average 3 out of 5 starsfootnote Average 3 out of 5 starsfootnote Average 2.9 Miles [facility] [address] [place], [place] [address] [phone]   Add [facility] My Favorites- Opens in a new window 2 out of 5 starsfootnote Below Average 2 out of 5 starsfootnote Below Average 2 out of 5 starsfootnote Below Average 2 out of 5 starsfootnote Below Average 3.0 Miles [facility] [address] [place], [place] [address] [phone]   Add [facility] My Favorites- Opens in a new window 4 out of 5 starsfootnote Above Average 4 out of 5 starsfootnote Above Average 2 out of 5 starsfootnote Below Average 4 out of 5 starsfootnote Above Average 10.7 Miles [facility] [address] [place], [place] [address] [phone]   Add [facility] My Favorites- Opens in a new window 3 out of 5 starsfootnote Average 3 out of 5 starsfootnote Average 2 out of 5 starsfootnote Below Average 3 out of 5 starsfootnote Average 12.5 Miles [facility] [address] [place], [place] [address] [phone]   Add [facility] My Favorites- Opens in a new window 1 out of 5 starsfootnote Much Below Average 2 out of 5 starsfootnote Below Average 1 out of 5 starsfootnote Much Below Average 3 out of 5 starsfootnote Average 14.7 Miles [facility] [address] [place], [place] [address] [phone]   Add [facility] My Favorites- Opens in a new window 4 out of 5 starsfootnote Above Average 3 out of 5 starsfootnote Average 2 out of 5  starsfootnote Below Average 5 out of 5 starsfootnote Much Above Average 22.5 Miles [facility] [address] [place], [place] [address] [phone]   Add [facility] My Favorites- Opens in a new window 1 out of 5 starsfootnote Much Below Average 1 out of 5 starsfootnote Much Below Average 2 out of 5 starsfootnote Below Average 2 out of 5 starsfootnote Below Average 24.2 Miles [facility] [address] [place], [place] [address] [phone]   Add [facility] My Favorites- Opens in a new window 3 out of 5 starsfootnote Average 3 out of 5 starsfootnote Average 2 out of 5 starsfootnote Below Average 2 out of 5 starsfootnote Below Average 24.4 Miles [facility] [address] [place], [place] [address] [phone]   Add [facility] My Favorites- Opens in a new window 1 out of 5 starsfootnote Much Below Average 1 out of 5 starsfootnote Much Below Average 2 out of 5 starsfootnote Below Average 3 out of 5 starsfootnote Average 25.5 Miles [facility] [address] [place], [place] [address] [phone]   Add [facility] My Favorites- Opens in a new window 2 out of 5 starsfootnote Below Average 2 out of 5 starsfootnote Below Average 2 out of 5 starsfootnote Below Average 3 out of 5 starsfootnote Average 25.9 Miles [facility] [address] [place], [place] [address] [phone]   Add [facility] My Favorites-   Opens in a new window 5 out of 5 starsfootnote Much Above Average 5 out of 5 starsfootnote Much Above Average 3 out of 5 starsfootnote Average 2 out of 5 starsfootnote Below Average 26.1 Miles [facility] [address] [place] [address] [phone]   Add [facility] My Favorites- Opens in a new window 3 out of 5 starsfootnote Average 3 out of 5 starsfootnote Average 2 out of 5 starsfootnote Below Average 4 out of 5 starsfootnote Above Average 26.7 Miles [facility] [address] [place] [address] [phone]   Add [facility] My Favorites- Opens in a new window 3 out of 5 starsfootnote Average 3 out of 5 starsfootnote Average 3 out of 5 starsfootnote Average 4 out of 5 starsfootnote Above Average 27.0 Miles [facility] [address] [place], [place] [address] [phone]   Add [facility] My Favorites- Opens in a new window 2 out of 5 starsfootnote Below Average 2 out of 5 starsfootnote Below Average 2 out of 5 starsfootnote Below Average 2 out of 5 starsfootnote Below Average 27.0 Miles [facility] [address] [place] [address] [phone]   Add [facility] My Favorites- Opens in a new window 1 out of 5 starsfootnote Much Below Average 1 out of 5 starsfootnote Much Below Average 2 out of 5 starsfootnote Below Average 1 out of 5 starsfootnote Much Below Average 27.1 Miles [facility] [address] [place] [address] [phone]   Add [facility] My Favorites- Opens in a new window 3 out of 5 starsfootnote Average 3 out of 5 starsfootnote Average 2 out of 5 starsfootnote Below Average 4 out of 5 starsfootnote Above Average 27.8 Miles [facility] [address] [place], [place] [address] [phone]   Add [facility] My Favorites- Opens in a new window 5 out of 5 starsfootnote Much Above Average 5 out of 5 starsfootnote Much Above Average 5 out of 5 starsfootnote Much Above  Average 5 out of 5 starsfootnote Much Above Average 28.0 Miles [facility] [address] [place], [place] [address] [phone]   Add [facility] My Favorites- Opens in a new window 5 out of 5 starsfootnote Much Above Average 4 out of 5 starsfootnote Above Average 5 out of 5 starsfootnote Much Above Average 5 out of 5 starsfootnote Much Above Average 28.1 Miles [facility] [address] [place], [place] [address] [phone]   Add [facility] My Favorites- Opens in a new window 5 out of 5 starsfootnote Much Above Average 4 out of 5 starsfootnote Above Average 5 out of 5 starsfootnote Much Above Average 5 out of 5 starsfootnote Much Above Average 28.2 Miles [facility] [address] [place], [place] [address] [phone]   Add [facility] My Favorites- Opens in a new window 5 out of 5 starsfootnote Much Above Average 4 out of 5 starsfootnote Above Average 5 out of 5 starsfootnote Much Above Average 5 out of 5 starsfootnote Much Above Average 28.3 Miles  

## 2019-02-10 NOTE — TOC Initial Note (Addendum)
Transition of Care [facility]) - Initial/Assessment Note    Patient Details  Name: Linda Donaldson MRN: [id number] Date of Birth: Dec 31, 1922  Transition of Care [facility]) CM/SW Contact:    Giancarlos Berendt, Chrystine Oiler, RN Phone Number: 02/10/2019, 2:10 PM  Clinical Narrative:  Acute right pontine infarct.. From home alone, independent, A&O.  Has Cane and RW. Supportive family. Recommended for SNF. Patient is agreeable but would also like for CM to talk with Tiffany her granddaughter. Call to Tiffany multiple times, line busy.   Discussed with attending, family stated to him that they would be interested in SNF for patient.  Will send referral and keep trying to contact Tiffany.    ADDENDUM: 1530 Spoke with Tiffany, she is in agreement, discussed CMS list for SNF. Also left CMS list with bedside RN to be given to grand daughter as patient is on contact precautions.    Expected Discharge Plan: Skilled Nursing Facility Barriers to Discharge: Continued Medical Work up, SNF Pending bed offer   Patient Goals and CMS Choice Patient states their goals for this hospitalization and ongoing recovery are:: go to rehab then home; CMS Medicare.gov Compare Post Acute Care list provided to:: Patient Choice offered to / list presented to : Patient  Expected Discharge Plan and Services Expected Discharge Plan: Skilled Nursing Facility   Discharge Planning Services: CM Consult Post Acute Care Choice: Nursing Home Living arrangements for the past 2 months: Independent Living Facility                       Prior Living Arrangements/Services Living arrangements for the past 2 months: Independent Living Facility Lives with:: Self Patient language and need for interpreter reviewed:: Yes        Need for Family Participation in Patient Care: Yes (Comment) Care giver support system in place?: Yes (comment) Current home services: DME(cane, RW) Criminal Activity/Legal Involvement Pertinent to Current  Situation/Hospitalization: No - Comment as needed  Activities of Daily Living Home Assistive Devices/Equipment: Environmental consultant (specify type), Eyeglasses ADL Screening (condition at time of admission) Patient's cognitive ability adequate to safely complete daily activities?: Yes Is the patient deaf or have difficulty hearing?: No Does the patient have difficulty seeing, even when wearing glasses/contacts?: No Does the patient have difficulty concentrating, remembering, or making decisions?: No Patient able to express need for assistance with ADLs?: Yes Does the patient have difficulty dressing or bathing?: No Independently performs ADLs?: Yes (appropriate for developmental age) Does the patient have difficulty walking or climbing stairs?: Yes Weakness of Legs: Both Weakness of Arms/Hands: None   Emotional Assessment   Attitude/Demeanor/Rapport: Engaged Affect (typically observed): Accepting Orientation: : Oriented to Self, Oriented to Place, Oriented to  Time Alcohol / Substance Use: Not Applicable Psych Involvement: No (comment)  Admission diagnosis:  Dizziness [R42] Elevated troponin [R77.8] Patient Active Problem List   Diagnosis Date Noted  . Acute CVA (cerebrovascular accident) (HCC)   . Cardiomyopathy (HCC)   . Diarrhea   . Lower extremity edema 02/09/2019  . Hyperlipidemia 02/09/2019  . Stroke-like symptoms 02/09/2019  . NSTEMI (non-ST elevated myocardial infarction) (HCC) 02/09/2019  . Stroke-like symptom 02/09/2019  . COUGH 03/08/2010  . INGUINAL HERNIA 10/21/2008  . ARTHRITIS 11/26/2007  . DYSPHAGIA 11/26/2007  . GERD 08/21/2007  . ESOPHAGEAL STRICTURE 10/23/2005  . HIATAL HERNIA 10/23/2005   PCP:  Elias Else, MD Pharmacy:   [facility] [place] [address] [place] [address] Phone: [phone] Fax:  [phone]     Social Determinants of Health (SDOH) Interventions    Readmission Risk Interventions No  flowsheet data found.

## 2019-02-10 NOTE — NC FL2 (Addendum)
[place] MEDICAID FL2 LEVEL OF CARE SCREENING TOOL     IDENTIFICATION  Patient Name: Linda Donaldson Birthdate: 05/13/1922 Sex: female Admission Date (Current Location): 02/08/2019  [facility] and [place] Number:  [employer] and Address:  [place] [address] [place]      Provider Number: [id number]  Attending Physician Name and Address:  Roxan Hockey, MD  Relative Name and Phone Number:  Vladimir Creeks [phone]    Current Level of Care: Hospital Recommended Level of Care: [place] Prior Approval Number:    Date Approved/Denied: 02/10/19 PASRR Number: [id number]  Discharge Plan: SNF    Current Diagnoses: Patient Active Problem List   Diagnosis Date Noted  . Acute CVA (cerebrovascular accident) ([place])   . Cardiomyopathy ([place])   . Diarrhea   . Lower extremity edema 02/09/2019  . Hyperlipidemia 02/09/2019  . Stroke-like symptoms 02/09/2019  . NSTEMI (non-ST elevated myocardial infarction) ([place]) 02/09/2019  . Stroke-like symptom 02/09/2019  . COUGH 03/08/2010  . INGUINAL HERNIA 10/21/2008  . ARTHRITIS 11/26/2007  . DYSPHAGIA 11/26/2007  . GERD 08/21/2007  . ESOPHAGEAL STRICTURE 10/23/2005  . HIATAL HERNIA 10/23/2005    Orientation RESPIRATION BLADDER Height & Weight     Self, Time, Situation  O2 External catheter Weight: 81 kg Height:  5\' 3"  (160 cm)  BEHAVIORAL SYMPTOMS/MOOD NEUROLOGICAL BOWEL NUTRITION STATUS      (rectal tube) Diet  AMBULATORY STATUS COMMUNICATION OF NEEDS Skin   Extensive Assist Verbally Normal                       Personal Care Assistance Level of Assistance  Bathing, Feeding, Dressing Bathing Assistance: Limited assistance Feeding assistance: Limited assistance Dressing Assistance: Limited assistance     Functional Limitations Info  Sight, Hearing, Speech Sight Info: Adequate Hearing Info: Adequate Speech Info: Adequate    SPECIAL CARE  FACTORS FREQUENCY  PT (By licensed PT)     PT Frequency: 5 x/ week      SLP 3 x/ week  OT 3x /week      Contractures Contractures Info: Not present    Additional Factors Info  Code Status, Allergies Code Status Info: DNR Allergies Info: ASPIRIN, CIPRO, PENICILLIN           Current Medications (02/10/2019):  This is the current hospital active medication list Current Facility-Administered Medications  Medication Dose Route Frequency Provider Last Rate Last Dose  . atorvastatin (LIPITOR) tablet 80 mg  80 mg Oral q1800 Emokpae, Courage, MD   80 mg at 02/09/19 1740  . Chlorhexidine Gluconate Cloth 2 % PADS 6 each  6 each Topical Daily Oran Rein, MD   6 each at 02/09/19 [phone]  . clopidogrel (PLAVIX) tablet 75 mg  75 mg Oral Daily Oran Rein, MD   75 mg at 02/10/19 1058  . dorzolamide-timolol (COSOPT) 22.3-6.8 MG/ML ophthalmic solution 1 drop  1 drop Both Eyes BID [place] Brick, Courage, MD   1 drop at 02/10/19 1059  . feeding supplement (ENSURE ENLIVE) (ENSURE ENLIVE) liquid 237 mL  237 mL Oral BID BM Kathie Dike, MD   237 mL at 02/09/19 1601  . fluticasone (FLONASE) 50 MCG/ACT nasal spray 2 spray  2 spray Each Nare Daily Oran Rein, MD   2 spray at 02/10/19 1058  . heparin injection 5,000 Units  5,000 Units Subcutaneous Q8H Emokpae, Courage, MD      . latanoprost (XALATAN) 0.005 % ophthalmic solution 1 drop  1  drop Both Eyes QHS Shon Hale, MD   1 drop at 02/09/19 2212  . metoprolol succinate (TOPROL-XL) 24 hr tablet 12.5 mg  12.5 mg Oral BID Prentice Docker A, MD   12.5 mg at 02/10/19 1058  . multivitamin with minerals tablet 1 tablet  1 tablet Oral Daily Emokpae, Courage, MD      . ondansetron (ZOFRAN) injection 4 mg  4 mg Intravenous Q6H PRN Mariea Clonts, Courage, MD   4 mg at 02/10/19 0028  . pantoprazole (PROTONIX) EC tablet 40 mg  40 mg Oral Daily Kirstie Peri, MD   40 mg at 02/10/19 1058  . potassium chloride (KLOR-CON) CR tablet 10 mEq  10 mEq Oral BID Shon Hale, MD   10 mEq at 02/10/19 1104  . trimethoprim (TRIMPEX) tablet 100 mg  100 mg Oral Daily Kirstie Peri, MD   100 mg at 02/10/19 1058     Discharge Medications: Please see discharge summary for a list of discharge medications.  Relevant Imaging Results:  Relevant Lab Results:   Additional Information SSN [id number]  Araceli Coufal, Chrystine Oiler, RN

## 2019-02-10 NOTE — Progress Notes (Signed)
Initial Nutrition Assessment  DOCUMENTATION CODES:   Obesity unspecified  INTERVENTION:  Ensure Enlive po BID, each supplement provides 350 kcal and 20 grams of protein   Multivitamin and mineral daily   NUTRITION DIAGNOSIS:   Inadequate oral intake related to acute illness, diarrhea as evidenced by per patient/family report, meal completion < 50%.  GOAL:  Patient will meet greater than or equal to 90% of their needs  MONITOR:  (Tolerance of diet and acceptance of oral supplments.)   REASON FOR ASSESSMENT:   Malnutrition Screening Tool   ASSESSMENT: Patient is and 85 yo female from home. She presents to the ED on 10/4 with generalized weakness and dizziness.  Patient complains of 2 weeks of diarrhea. C.diff antigen-negative. Hx of GERD, Carotid artery stenosis, fluid retention in her legs.  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 CT-Angio of Head-Aortic atherosclerosis.   Ms Losano is up in chair during RD visit. Her breakfast is here  untouched- except she drank all her apple juice and had the RN bring a container of applesauce which she was able to eat. She endorses fear of eating solid foods. Her intake has been down since the diarrhea started 2 weeks ago. She drinks Ensure at home in the evening as a meal replacement- says "I don't cook anymore". Expect she is having difficulty maintaining adequate nutrition and would likely benefit from some assistance if she qualifies.   Per weight history review -there has been minor decrease in her weight over the past 6 years: range of 81-82 kg.   Medications reviewed and include: Lipitor, Protonix, Klor-Con and Trimpex.   Labs reviewed: WBC-10.6 and Alb 3.3 BMP Latest Ref Rng & Units 02/10/2019 02/08/2019 02/24/2013  Glucose 70 - 99 mg/dL 118(H) 137(H) 122(H)  BUN 8 - 23 mg/dL 20 21 15   Creatinine 0.44 -  1.00 mg/dL 0.99 0.96 1.10  Sodium 135 - 145 mmol/L 138 136 139  Potassium 3.5 - 5.1 mmol/L 3.6 3.9 3.3(L)  Chloride 98 - 111 mmol/L 106 105 101  CO2 22 - 32 mmol/L 23 22 -  Calcium 8.9 - 10.3 mg/dL 9.5 9.9 -     NUTRITION - FOCUSED PHYSICAL EXAM:  deferred   Diet Order:   Diet Order            DIET DYS 3 Room service appropriate? Yes; Fluid consistency: Thin  Diet effective now              EDUCATION NEEDS:  Not appropriate for education at this time   Skin:  Skin Assessment: Reviewed RN Assessment  Last BM:  10/7 diarrhea  Height:   Ht Readings from Last 1 Encounters:  02/09/19 5\' 3"  (1.6 m)    Weight:   Wt Readings from Last 1 Encounters:  02/09/19 81 kg    Ideal Body Weight:  52 kg  BMI:  Body mass index is 31.63 kg/m.  Estimated Nutritional Needs:   Kcal:  1420-1656 (MSJ x1.2-1.4 AF)  Protein:  78-83 gr (1.4-1.6 gr/kg/ibw)  Fluid:  >1400 ml daily  Colman Cater MS,RD,CSG,LDN Office: [phone] Pager: [phone]

## 2019-02-10 NOTE — Evaluation (Signed)
Occupational Therapy Evaluation Patient Details Name: Linda Donaldson MRN: [id number] DOB: 20-Sep-1922 Today's Date: 02/10/2019    History of Present Illness 85 year old woman ADL/IADL independent with past medical history is reviewed in the EMR was brought to the ER with complaint of generalized weakness and dizziness.  As per patient she has not been feeling well throughout on 02/08/2019.  She states that yesterday evening around 6 PM she was walking with her walker when her legs gave way and she leaned against a TV cabinet and lowered herself down to the ground without hitting her head.  She had no loss of consciousness no bladder or bowel incontinence.  No blackout, any preceding dizziness or headache.  At the time of my interview patient stated that she has been having these symptoms for past 8 to 10 days which she describes as a feeling of unwell'' something passing through her body''.  She had no complaint of fever, chill, cough, congestion, nausea, vomiting, diarrhea, chest pain, shortness of breath, palpitation, headache and blurring of vision.  No tingling or numbness sensation at the time of interview.  As she complained of acute weakness in her left leg and was symptomatic for strokelike features code stroke was called.   Clinical Impression   Pt is sitting up in recliner with family member present upon therapy arrival and agreeable to participate in OT evaluation. Patient presents with decreased strength, endurance, and ROM with the LUE in addition to possible baseline visual impairments. Patient requires increased physical assistance to complete basic ADL tasks. Recommend discharge to SNF to focus on mentioned deficits. OT will follow patient acutely during stay.     Follow Up Recommendations  SNF    Equipment Recommendations  Other (comment)(TBD)       Precautions / Restrictions Precautions Precautions: Fall Restrictions Weight Bearing Restrictions: No      Mobility   Transfers Overall transfer level: Needs assistance Equipment used: Rolling walker (2 wheeled) Transfers: Sit to/from Stand Sit to Stand: Mod assist              Balance Overall balance assessment: Needs assistance Sitting-balance support: Bilateral upper extremity supported;Feet supported Sitting balance-Leahy Scale: Zero      ADL either performed or assessed with clinical judgement   ADL Overall ADL's : Needs assistance/impaired Eating/Feeding: Sitting;Set up   Grooming: Wash/dry hands;Wash/dry face;Supervision/safety;Set up;Sitting   Upper Body Bathing: Moderate assistance;Sitting   Lower Body Bathing: Moderate assistance;Sit to/from stand   Upper Body Dressing : Moderate assistance;Sitting   Lower Body Dressing: Moderate assistance;Sit to/from stand   Toilet Transfer: Moderate assistance;BSC;RW           Functional mobility during ADLs: Moderate assistance;Rolling walker       Vision Baseline Vision/History: Wears glasses Wears Glasses: Reading only Patient Visual Report: Diplopia;Peripheral vision impairment Vision Assessment?: Yes Alignment/Gaze Preference: Head tilt Visual Fields: Right homonymous hemianopsia;Other (comment)(Pt reports history of glaucoma. Unsure if visual field cut has been ongoing before CVA or not.) Diplopia Assessment: Other (comment)(baseline issue. Patient is currently being monitored by her Eye doctor.)            Pertinent Vitals/Pain Pain Assessment: No/denies pain     Hand Dominance Right   Extremity/Trunk Assessment Upper Extremity Assessment Upper Extremity Assessment: LUE deficits/detail LUE Deficits / Details: Slightly decrease A/ROM compared to RUE. Decreased gross and fine motor coordination. Decreased speed and fluidity of movement. Unable to place hand behind head (er) or behind back (IR). Decreased gross grasp.Shoulder, elbow, and wrist MMT:  3+/5 LUE Coordination: decreased fine motor;decreased gross motor    Lower Extremity Assessment Lower Extremity Assessment: Defer to PT evaluation       Communication Communication Communication: No difficulties   Cognition Arousal/Alertness: Awake/alert Behavior During Therapy: WFL for tasks assessed/performed Overall Cognitive Status: Within Functional Limits for tasks assessed                  Home Living Family/patient expects to be discharged to:: Skilled nursing facility Living Arrangements: Alone Available Help at Discharge: Family;Available PRN/intermittently Type of Home: Apartment Home Access: Level entry     Home Layout: One level     Bathroom Shower/Tub: Tub/shower unit     Bathroom Accessibility: Yes   Home Equipment: Walker - 4 wheels;Cane - single point          Prior Functioning/Environment Level of Independence: Independent with assistive device(s)        Comments: household and short distanced community ambulator with [employer]        OT Problem List: Decreased strength;Decreased coordination;Decreased range of motion;Decreased activity tolerance;Impaired balance (sitting and/or standing);Impaired UE functional use      OT Treatment/Interventions: Self-care/ADL training;Therapeutic exercise;Therapeutic activities;Neuromuscular education;Energy conservation;DME and/or AE instruction;Patient/family education;Balance training    OT Goals(Current goals can be found in the care plan section) Acute Rehab OT Goals Patient Stated Goal: return home at [facility] OT Goal Formulation: With patient Time For Goal Achievement: 02/24/19 Potential to Achieve Goals: Good  OT Frequency: Min 2X/week   Barriers to D/C: Decreased caregiver support  Pt lives in a Independent living facility (apt)          AM-PAC OT "6 Clicks" Daily Activity     Outcome Measure Help from another person eating meals?: A Little Help from another person taking care of personal grooming?: A Lot Help from another person toileting, which includes  using toliet, bedpan, or urinal?: A Lot Help from another person bathing (including washing, rinsing, drying)?: A Lot Help from another person to put on and taking off regular upper body clothing?: A Lot Help from another person to put on and taking off regular lower body clothing?: A Lot 6 Click Score: 13   End of Session Equipment Utilized During Treatment: Gait belt;Rolling walker  Activity Tolerance: Patient tolerated treatment well Patient left: in chair;with call bell/phone within reach;with chair alarm set;with family/visitor present  OT Visit Diagnosis: Muscle weakness (generalized) (M62.81)                Time: 9381-0175 OT Time Calculation (min): 21 min Charges:  OT General Charges $OT Visit: 1 Visit OT Evaluation $OT Eval Moderate Complexity: 1 [address] OTR/L,CBIS  [phone]   Essenmacher, Charisse March 02/10/2019, 2:52 PM

## 2019-02-10 NOTE — Progress Notes (Signed)
  Speech Language Pathology Treatment: Dysphagia  Patient Details Name: KIRSTI MCALPINE MRN: [id number] DOB: [birth year] Today's Date: 02/10/2019 Time: 0177-9390 SLP Time Calculation (min) (ACUTE ONLY): 18 min  Assessment / Plan / Recommendation Clinical Impression  SLP provided ongoing diagnostic dysphagia therapy while Pt was consuming her D3/mech soft and thin liquid lunch tray. Pt demonstrated prolonged mastication of mech soft textures and reported she does not "feel comfortable" with the size of the meat even though it is "soft". Pt was noted to utilize slow rate and only minimal oral residue was noted; liquid continues to be effective for clearing oral cavity of residue. Recommend downgrade to D2/fine chop diet and continue with thin liquids. ST will continue to follow   HPI HPI: 85 year old woman ADL/IADL independent with past medical history is reviewed in the EMR was brought to the ER with complaint of generalized weakness and dizziness.  As per patient she has not been feeling well throughout on 02/08/2019.  She states that yesterday evening around 6 PM she was walking with her walker when her legs gave way and she leaned against a TV cabinet and lowered herself down to the ground without hitting her head.  She had no loss of consciousness no bladder or bowel incontinence.  No blackout, any preceding dizziness or headache.  At the time of my interview patient stated that she has been having these symptoms for past 8 to 10 days which she describes as a feeling of unwell'' something passing through her body''.  She had no complaint of fever, chill, cough, congestion, nausea, vomiting, diarrhea, chest pain, shortness of breath, palpitation, headache and blurring of vision.  No tingling or numbness sensation at the time of interview.  As she complained of acute weakness in her left leg and was symptomatic for strokelike features code stroke was called. MRI shows: 1.4 x 0.8 cm acute right pontine  infarct. BSE requested.      SLP Plan  Continue with current plan of care       Recommendations  Diet recommendations: Dysphagia 2 (fine chop);Thin liquid Liquids provided via: Cup;Straw Medication Administration: Whole meds with liquid Supervision: Patient able to self feed;Intermittent supervision to cue for compensatory strategies Compensations: Lingual sweep for clearance of pocketing;Small sips/bites Postural Changes and/or Swallow Maneuvers: Seated upright 90 degrees;Upright 30-60 min after meal                Plan: Continue with current plan of care       Zaharah Amir H. Roddie Mc, CCC-SLP Speech Language Pathologist       Wende Bushy 02/10/2019, 1:18 PM

## 2019-02-10 NOTE — Plan of Care (Signed)
  Problem: Acute Rehab PT Goals(only PT should resolve) Goal: Pt Will Go Supine/Side To Sit Outcome: Progressing Flowsheets (Taken 02/10/2019 0958) Pt will go Supine/Side to Sit:  with minimal assist  with min guard assist Goal: Patient Will Transfer Sit To/From Stand Outcome: Progressing Flowsheets (Taken 02/10/2019 0958) Patient will transfer sit to/from stand: with minimal assist Goal: Pt Will Transfer Bed To Chair/Chair To Bed Outcome: Progressing Flowsheets (Taken 02/10/2019 0958) Pt will Transfer Bed to Chair/Chair to Bed: with min assist Goal: Pt Will Ambulate Outcome: Progressing Flowsheets (Taken 02/10/2019 0958) Pt will Ambulate:  25 feet  with minimal assist  with moderate assist  with rolling walker   9:59 AM, 02/10/19 Lonell Grandchild, MPT Physical Therapist with [facility] 336 [phone] office [phone] mobile phone

## 2019-02-10 NOTE — Progress Notes (Signed)
Patient Demographics:    Anacarolina Evelyn, is a 85 y.o. female, DOB - 1922/09/25, QIO:[id number]  Admit date - 02/08/2019   Admitting Physician Kirstie Peri, MD  Outpatient Primary MD for the patient is Elias Else, MD  LOS - 1   Chief Complaint  Patient presents with   Weakness        Subjective:    Corena Earnest today has no fevers, no emesis,  No chest pain, resting comfortably, eating and drinking okay, GI symptoms improving  Assessment  & Plan :    Principal Problem:   Stroke-like symptoms Active Problems:   GERD   Lower extremity edema   Hyperlipidemia   NSTEMI (non-ST elevated myocardial infarction) (HCC)   Stroke-like symptom   Acute CVA (cerebrovascular accident) (HCC)   Cardiomyopathy (HCC)   Diarrhea  Brief Summary:- 85 year old woman ADL/IADL independent admitted from independent living apartment admitted on 02/09/2019 with strokelike symptoms and elevated troponin   A/p 1)1.4 x 0.8 cm acute right pontine infarct.- MRI brain confirms acute pontine stroke, CT head and CTA neck without LVO -Echocardiogram with EF of 35 to 40%, with severe hypokinesis/akinesis of the distal anterior, mid/distal anterolateral, distal inferior and apical walls -  neurology consult from Dr Gerilyn Pilgrim appreciated -Speech and physical therapy eval appreciated -LDL 74 with HDL of 54,, A1c is 5.5 and TSH is 2.0 -PTA patient was on pravastatin 40 mg daily, changed to Lipitor 80 mg daily Even if his lipid panel is within desired limits, patient should still take Lipitor/Statin for it's Pleiotropic effects (beyond cholesterol lowering benefits) -PTA patient was not on antiplatelet agents (intolerant of aspirin) c/n  Plavix 75 mg daily -Physical therapist recommends SNF rehab  2)NSTEMI-  elevated troponin in the setting of acute stroke--- ?? NSTEMI-  echocardiogram with reduced EF and significant wall  motion abnormalities as above #1-Troponin 2547 previous level 2559 -Change metoprolol 12.5 mg twice daily, to Toprol-XL 12.5 mg daily , c/n Lipitor as above, Plavix as above -Chest pain-free -Cardiology consult appreciated  3)Nausea /vomiting and diarrhea--- multiple episodes of loose stools,  stool for C. difficile is negative and stool for GI pathogen is pending . Zofran PRN nausea vomiting -Hold Lasix due to concerns about fluid losses and risk for dehydration -Diarrhea and nausea appears to have improved significantly  4)HFrEF/ischemic cardiomyopathy--- EF 35 to 40% as noted in #1 above hold Lasix, use TEDs for LE edema -Toprol-XL as above #2  5)Social/Ethics--DNR/DNI, plan of care and goals of care discussed with patient and POA/great-granddaughter Tiffany at bedside  Disposition/Need for in-Hospital Stay- patient unable to be discharged at this time due to -acute stroke, acute MI, awaiting insurance approval for transfer to SNF rehab*  Code Status : DNR  Family Communication:    (patient is alert, awake and coherent) -POA/great-granddaughter Tiffany at bedside  Disposition Plan  : SNF  Consults  : Neurology/cardiology  DVT Prophylaxis  :   - Heparin - SCDs   Lab Results  Component Value Date   PLT 196 02/10/2019   Inpatient Medications  Scheduled Meds:  atorvastatin  80 mg Oral q1800   Chlorhexidine Gluconate Cloth  6 each Topical Daily   clopidogrel  75 mg Oral Daily   dorzolamide-timolol  1 drop Both Eyes  BID   feeding supplement (ENSURE ENLIVE)  237 mL Oral BID BM   fluticasone  2 spray Each Nare Daily   latanoprost  1 drop Both Eyes QHS   metoprolol succinate  12.5 mg Oral BID   multivitamin with minerals  1 tablet Oral Daily   pantoprazole  40 mg Oral Daily   potassium chloride  10 mEq Oral BID   trimethoprim  100 mg Oral Daily   Continuous Infusions: PRN Meds:.ondansetron (ZOFRAN) IV  Anti-infectives (From admission, onward)   Start      Dose/Rate Route Frequency Ordered Stop   02/09/19 1000  trimethoprim (TRIMPEX) tablet 100 mg     100 mg Oral Daily 02/09/19 0508         Objective:   Vitals:   02/10/19 0600 02/10/19 0700 02/10/19 0730 02/10/19 1123  BP: (!) 121/95 98/86    Pulse: 67 73 65 77  Resp: 20 17 (!) 22 (!) 21  Temp:   99 F (37.2 C) 97.7 F (36.5 C)  TempSrc:   Oral Oral  SpO2: 98% 95% 95% 98%  Weight:      Height:        Wt Readings from Last 3 Encounters:  02/09/19 81 kg  02/24/13 82.1 kg  09/11/12 84.8 kg    Intake/Output Summary (Last 24 hours) at 02/10/2019 1246 Last data filed at 02/09/2019 2000 Gross per 24 hour  Intake 120 ml  Output --  Net 120 ml    Physical Exam Gen:- Awake Alert,  In no apparent distress  HEENT:- [place].AT, No sclera icterus Eyes-diplopia and visual symptoms improving Neck-Supple Neck,No JVD,.  Lungs-  CTAB , fair symmetrical air movement CV- S1, S2 normal, regular  Abd-  +ve B.Sounds, Abd Soft, No tenderness,    Extremity/Skin:- 1+ve  edema, pedal pulses present  Psych-affect is appropriate, oriented x3 Neuro-generalized weakness, facial asymmetry, subtle left-sided weakness, unsteady gait/ataxia, no tremors   Data Review:   Micro Results Recent Results (from the past 240 hour(s))  Blood culture (routine x 2)     Status: None (Preliminary result)   Collection Time: 02/09/19 12:23 AM   Specimen: BLOOD LEFT FOREARM  Result Value Ref Range Status   Specimen Description BLOOD LEFT FOREARM  Final   Special Requests   Final    BOTTLES DRAWN AEROBIC AND ANAEROBIC Blood Culture adequate volume   Culture   Final    NO GROWTH < 12 HOURS Performed at [facility], [address], [place], [place] [address]    Report Status PENDING  Incomplete  Blood culture (routine x 2)     Status: None (Preliminary result)   Collection Time: 02/09/19 12:35 AM   Specimen: BLOOD RIGHT HAND  Result Value Ref Range Status   Specimen Description BLOOD RIGHT HAND  Final   Special  Requests   Final    BOTTLES DRAWN AEROBIC AND ANAEROBIC Blood Culture adequate volume   Culture   Final    NO GROWTH < 12 HOURS Performed at [facility], [address], [place], [place] [address]    Report Status PENDING  Incomplete  SARS CORONAVIRUS 2 (TAT 6-24 HRS) Nasopharyngeal Nasopharyngeal Swab     Status: None   Collection Time: 02/09/19  4:19 AM   Specimen: Nasopharyngeal Swab  Result Value Ref Range Status   SARS Coronavirus 2 NEGATIVE NEGATIVE Final    Comment: (NOTE) SARS-CoV-2 target nucleic acids are NOT DETECTED. The SARS-CoV-2 RNA is generally detectable in upper and lower respiratory  specimens during the acute phase of infection. Negative results do not preclude SARS-CoV-2 infection, do not rule out co-infections with other pathogens, and should not be used as the sole basis for treatment or other patient management decisions. Negative results must be combined with clinical observations, patient history, and epidemiological information. The expected result is Negative. Fact Sheet for Patients: HairSlick.nohttps://www.fda.gov/media/138098/download Fact Sheet for Healthcare Providers: quierodirigir.comhttps://www.fda.gov/media/138095/download This test is not yet approved or cleared by the [place] FDA and  has been authorized for detection and/or diagnosis of SARS-CoV-2 by FDA under an Emergency Use Authorization (EUA). This EUA will remain  in effect (meaning this test can be used) for the duration of the COVID-19 declaration under Section 56 4(b)(1) of the Act, 21 U.S.C. section 360bbb-3(b)(1), unless the authorization is terminated or revoked sooner. Performed at [facility], [address] [place], [place] [address]   MRSA PCR Screening     Status: None   Collection Time: 02/09/19  5:07 AM   Specimen: Nasal Mucosa; Nasopharyngeal  Result Value Ref Range Status   MRSA by PCR NEGATIVE NEGATIVE Final    Comment:        The GeneXpert MRSA Assay (FDA approved for NASAL  specimens only), is one component of a comprehensive MRSA colonization surveillance program. It is not intended to diagnose MRSA infection nor to guide or monitor treatment for MRSA infections. Performed at [facility], [address] [place], [place] [address]   C difficile quick scan w PCR reflex     Status: None   Collection Time: 02/09/19  4:28 PM   Specimen: STOOL  Result Value Ref Range Status   C Diff antigen NEGATIVE NEGATIVE Final   C Diff toxin NEGATIVE NEGATIVE Final   C Diff interpretation No C. difficile detected.  Final    Comment: Performed at [facility], [address] [place], [place] [address]   Radiology Reports Ct Angio Head W Or Wo Contrast  Result Date: 02/09/2019 CLINICAL DATA:  dizziness and weakness EXAM: CT ANGIOGRAPHY HEAD AND NECK TECHNIQUE: Multidetector CT imaging of the head and neck was performed using the standard protocol during bolus administration of intravenous contrast. Multiplanar CT image reconstructions and MIPs were obtained to evaluate the vascular anatomy. Carotid stenosis measurements (when applicable) are obtained utilizing NASCET criteria, using the distal internal carotid diameter as the denominator. CONTRAST:  100mL OMNIPAQUE IOHEXOL 350 MG/ML SOLN COMPARISON:  MRA 10/02/2012. FINDINGS: CTA NECK FINDINGS SKELETON: There is no bony spinal canal stenosis. No lytic or blastic lesion. OTHER NECK: Normal pharynx, larynx and major salivary glands. No cervical lymphadenopathy. Unremarkable thyroid gland. UPPER CHEST: No pneumothorax or pleural effusion. No nodules or masses. AORTIC ARCH: There is mild calcific atherosclerosis of the aortic arch. There is no aneurysm, dissection or hemodynamically significant stenosis of the visualized portion of the aorta. Conventional 3 vessel aortic branching pattern. The visualized proximal subclavian arteries are widely patent. RIGHT CAROTID SYSTEM: No dissection, occlusion or aneurysm. Mild atherosclerotic  calcification at the carotid bifurcation without hemodynamically significant stenosis. LEFT CAROTID SYSTEM: No dissection, occlusion or aneurysm. Mild atherosclerotic calcification at the carotid bifurcation without hemodynamically significant stenosis. VERTEBRAL ARTERIES: Left dominant configuration. Both origins are clearly patent. The right vertebral artery V3 segment is occluded, as it was on 10/02/2012. The left vertebral artery is normal. CTA HEAD FINDINGS POSTERIOR CIRCULATION: --Vertebral arteries: Both V4 segments are opacified. --Posterior inferior cerebellar arteries (PICA): Patent origins from the vertebral arteries. --Anterior inferior cerebellar arteries (AICA): Patent origins from  the basilar artery. --Basilar artery: Normal. --Superior cerebellar arteries: Normal. --Posterior cerebral arteries: Normal. Both originate from the basilar artery. Posterior communicating arteries (p-comm) are diminutive or absent. ANTERIOR CIRCULATION: --Intracranial internal carotid arteries: Atherosclerotic calcification of the internal carotid arteries at the skull base without hemodynamically significant stenosis. --Anterior cerebral arteries (ACA): Normal. Both A1 segments are present. Patent anterior communicating artery (a-comm). --Middle cerebral arteries (MCA): Normal. VENOUS SINUSES: As permitted by contrast timing, patent. ANATOMIC VARIANTS: None Review of the MIP images confirms the above findings. IMPRESSION: 1. No intracranial arterial occlusion or high-grade stenosis. 2. Occlusion of the right vertebral artery V3 segment, which was occluded on the MRA of 10/02/2012 3. Aortic Atherosclerosis (ICD10-I70.0). Electronically Signed   By: Ulyses Jarred M.D.   On: 02/09/2019 02:50   Dg Chest 2 View  Result Date: 02/09/2019 CLINICAL DATA:  Weakness EXAM: CHEST - 2 VIEW COMPARISON:  05/27/2008 FINDINGS: No pleural effusion. Diffuse coarse interstitial opacity with mild reticulation suggesting component of chronic  interstitial lung disease. Streaky opacities in the upper lobes and left base. Normal cardiomediastinal silhouette. No pneumothorax. IMPRESSION: 1. Diffuse coarse and mildly reticular interstitial opacity, suspect for component of chronic interstitial lung disease. Streaky opacities are present within the right greater than left upper lobe and the left lung base and may reflect acute superimposed pneumonia. Electronically Signed   By: Donavan Foil M.D.   On: 02/09/2019 01:17   Ct Head Wo Contrast  Result Date: 02/09/2019 CLINICAL DATA:  Vertigo, episodic EXAM: CT HEAD WITHOUT CONTRAST TECHNIQUE: Contiguous axial images were obtained from the base of the skull through the vertex without intravenous contrast. COMPARISON:  None. FINDINGS: Brain: No evidence of acute territorial infarction, hemorrhage, hydrocephalus,extra-axial collection or mass lesion/mass effect. There is dilatation the ventricles and sulci consistent with age-related atrophy. Low-attenuation changes in the deep white matter consistent with small vessel ischemia. Vascular: No hyperdense vessel or unexpected calcification. Skull: The skull is intact. No fracture or focal lesion identified. Sinuses/Orbits: The visualized paranasal sinuses and mastoid air cells are clear. The orbits and globes intact. Other: None IMPRESSION: No acute intracranial abnormality. Findings consistent with age related atrophy and chronic small vessel ischemia Electronically Signed   By: Prudencio Pair M.D.   On: 02/09/2019 01:03   Ct Angio Neck W And/or Wo Contrast  Result Date: 02/09/2019 CLINICAL DATA:  dizziness and weakness EXAM: CT ANGIOGRAPHY HEAD AND NECK TECHNIQUE: Multidetector CT imaging of the head and neck was performed using the standard protocol during bolus administration of intravenous contrast. Multiplanar CT image reconstructions and MIPs were obtained to evaluate the vascular anatomy. Carotid stenosis measurements (when applicable) are obtained  utilizing NASCET criteria, using the distal internal carotid diameter as the denominator. CONTRAST:  160mL OMNIPAQUE IOHEXOL 350 MG/ML SOLN COMPARISON:  MRA 10/02/2012. FINDINGS: CTA NECK FINDINGS SKELETON: There is no bony spinal canal stenosis. No lytic or blastic lesion. OTHER NECK: Normal pharynx, larynx and major salivary glands. No cervical lymphadenopathy. Unremarkable thyroid gland. UPPER CHEST: No pneumothorax or pleural effusion. No nodules or masses. AORTIC ARCH: There is mild calcific atherosclerosis of the aortic arch. There is no aneurysm, dissection or hemodynamically significant stenosis of the visualized portion of the aorta. Conventional 3 vessel aortic branching pattern. The visualized proximal subclavian arteries are widely patent. RIGHT CAROTID SYSTEM: No dissection, occlusion or aneurysm. Mild atherosclerotic calcification at the carotid bifurcation without hemodynamically significant stenosis. LEFT CAROTID SYSTEM: No dissection, occlusion or aneurysm. Mild atherosclerotic calcification at the carotid bifurcation without hemodynamically significant stenosis.  VERTEBRAL ARTERIES: Left dominant configuration. Both origins are clearly patent. The right vertebral artery V3 segment is occluded, as it was on 10/02/2012. The left vertebral artery is normal. CTA HEAD FINDINGS POSTERIOR CIRCULATION: --Vertebral arteries: Both V4 segments are opacified. --Posterior inferior cerebellar arteries (PICA): Patent origins from the vertebral arteries. --Anterior inferior cerebellar arteries (AICA): Patent origins from the basilar artery. --Basilar artery: Normal. --Superior cerebellar arteries: Normal. --Posterior cerebral arteries: Normal. Both originate from the basilar artery. Posterior communicating arteries (p-comm) are diminutive or absent. ANTERIOR CIRCULATION: --Intracranial internal carotid arteries: Atherosclerotic calcification of the internal carotid arteries at the skull base without hemodynamically  significant stenosis. --Anterior cerebral arteries (ACA): Normal. Both A1 segments are present. Patent anterior communicating artery (a-comm). --Middle cerebral arteries (MCA): Normal. VENOUS SINUSES: As permitted by contrast timing, patent. ANATOMIC VARIANTS: None Review of the MIP images confirms the above findings. IMPRESSION: 1. No intracranial arterial occlusion or high-grade stenosis. 2. Occlusion of the right vertebral artery V3 segment, which was occluded on the MRA of 10/02/2012 3. Aortic Atherosclerosis (ICD10-I70.0). Electronically Signed   By: Deatra Robinson M.D.   On: 02/09/2019 02:50   Mr Laqueta Jean IO Contrast  Result Date: 02/09/2019 CLINICAL DATA:  Ataxia, stroke suspected. Additional history: Vertigo x 2 days EXAM: MRI HEAD WITHOUT AND WITH CONTRAST TECHNIQUE: Multiplanar, multiecho pulse sequences of the brain and surrounding structures were obtained without and with intravenous contrast. CONTRAST:  68mL GADAVIST GADOBUTROL 1 MMOL/ML IV SOLN COMPARISON:  Non-contrast CT head and CT angiogram head/neck 02/09/2019, brain MRI 10/02/2012 (images unavailable), FINDINGS: Brain: Multiple sequences are motion degraded, limiting evaluation There is a 1.4 x 0.8 cm focus of restricted diffusion within the right pons consistent with acute infarction. Corresponding T2/FLAIR hyperintensity at this site. Advanced chronic small vessel ischemic disease with small bilateral chronic basal ganglia and thalamic lacunar infarcts, progressed from prior MRI 10/02/2012. No evidence of intracranial mass. No midline shift or extra-axial fluid collection. No chronic intracranial blood products. Mild generalized parenchymal atrophy. No abnormal intracranial enhancement is identified. Vascular: Flow voids maintained within the proximal large arterial vessels. Skull and upper cervical spine: No focal marrow lesion Sinuses/Orbits: Visualized orbits demonstrate no acute abnormality. Mild ethmoid sinus mucosal thickening.  Visualized mastoid air cells are clear. These results were called by telephone at the time of interpretation on 02/09/2019 at 1:19 pm to provider [facility] , who verbally acknowledged these results. IMPRESSION: 1. Motion degraded examination. 2. 1.4 x 0.8 cm acute right pontine infarct. 3. Advanced chronic small vessel ischemic disease with bilateral chronic basal ganglia/thalamic lacunar infarcts, progressed from prior MRI 10/02/2012. 4. Mild generalized parenchymal atrophy. Electronically Signed   By: Jackey Loge   On: 02/09/2019 13:20    CBC Recent Labs  Lab 02/08/19 2330 02/10/19 0411  WBC 8.8 10.6*  HGB 13.8 14.7  HCT 43.0 47.6*  PLT 213 196  MCV 96.0 99.0  MCH 30.8 30.6  MCHC 32.1 30.9  RDW 12.8 13.0  LYMPHSABS 1.9  --   MONOABS 0.9  --   EOSABS 0.2  --   BASOSABS 0.0  --    Chemistries  Recent Labs  Lab 02/08/19 2330 02/10/19 0411  NA 136 138  K 3.9 3.6  CL 105 106  CO2 22 23  GLUCOSE 137* 118*  BUN 21 20  CREATININE 0.96 0.99  CALCIUM 9.9 9.5  AST 22 29  ALT 9 13  ALKPHOS 49 42  BILITOT 0.4 0.5   ----------------------------------------------------------------------------------------------------------------- Recent Labs    02/09/19  0659  CHOL 151  HDL 54  LDLCALC 74  TRIG 116  CHOLHDL 2.8   Lab Results  Component Value Date   HGBA1C 5.5 02/09/2019   ------------------------------------------------------------------------------------------------------------------ Recent Labs    02/09/19 0659  TSH 2.043   ----------------------------------------------------------------------------------------------------------------- No results for input(s): VITAMINB12, FOLATE, FERRITIN, TIBC, IRON, RETICCTPCT in the last 72 hours.  Coagulation profile No results for input(s): INR, PROTIME in the last 168 hours.  No results for input(s): DDIMER in the last 72 hours.  Cardiac Enzymes No results for input(s): CKMB, TROPONINI, MYOGLOBIN in the last 168  hours.  Invalid input(s): CK ------------------------------------------------------------------------------------------------------------------    Component Value Date/Time   BNP 176.0 (H) 02/08/2019 2330   Shon Hale M.D on 02/10/2019 at 12:46 PM  Go to [email or web address] - for contact info  [facility] - Office  [phone]

## 2019-02-10 NOTE — Evaluation (Signed)
Physical Therapy Evaluation Patient Details Name: Linda Donaldson MRN: [id number] DOB: 27-Jul-1922 Today's Date: 02/10/2019   History of Present Illness  85 year old woman ADL/IADL independent with past medical history is reviewed in the EMR was brought to the ER with complaint of generalized weakness and dizziness.  As per patient she has not been feeling well throughout on 02/08/2019.  She states that yesterday evening around 6 PM she was walking with her walker when her legs gave way and she leaned against a TV cabinet and lowered herself down to the ground without hitting her head.  She had no loss of consciousness no bladder or bowel incontinence.  No blackout, any preceding dizziness or headache.  At the time of my interview patient stated that she has been having these symptoms for past 8 to 10 days which she describes as a feeling of unwell'' something passing through her body''.  She had no complaint of fever, chill, cough, congestion, nausea, vomiting, diarrhea, chest pain, shortness of breath, palpitation, headache and blurring of vision.  No tingling or numbness sensation at the time of interview.  As she complained of acute weakness in her left leg and was symptomatic for strokelike features code stroke was called.    Clinical Impression  Patient presents with moderately decreased visual field on right side, states she has a history of double vision and was seeing eye doctor.  Patient demonstrates slow labored movement for sitting up at bedside requiring most assistance for scooting up to bedside, limited to a few side steps at bedside due to LLE weakness, poor standing balance and tolerated sitting up in chair after therapy.  Patient will benefit from continued physical therapy in hospital and recommended venue below to increase strength, balance, endurance for safe ADLs and gait.    Follow Up Recommendations SNF    Equipment Recommendations  None recommended by PT    Recommendations for  Other Services       Precautions / Restrictions Precautions Precautions: Fall Restrictions Weight Bearing Restrictions: No      Mobility  Bed Mobility Overal bed mobility: Needs Assistance Bed Mobility: Supine to Sit     Supine to sit: Mod assist     General bed mobility comments: increased time, slow labored movement  Transfers Overall transfer level: Needs assistance Equipment used: Rolling walker (2 wheeled) Transfers: Sit to/from [facility] Sit to Stand: Mod assist Stand pivot transfers: Mod assist       General transfer comment: slow labored movement with diffiuclty advancing LLE  Ambulation/Gait Ambulation/Gait assistance: Mod assist;Max assist Gait Distance (Feet): 4 Feet Assistive device: Rolling walker (2 wheeled) Gait Pattern/deviations: Decreased step length - right;Decreased step length - left;Decreased stance time - left;Decreased stride length Gait velocity: slow   General Gait Details: limited to 5-6 slow unsteady labored side steps at bedside with diffiuclty advancing LLE due to weakness  Stairs            Wheelchair Mobility    Modified Rankin (Stroke Patients Only)       Balance Overall balance assessment: Needs assistance Sitting-balance support: Feet supported;No upper extremity supported Sitting balance-Leahy Scale: Fair Sitting balance - Comments: seated at bedside   Standing balance support: During functional activity;Bilateral upper extremity supported Standing balance-Leahy Scale: Poor Standing balance comment: fair/poor using RW                             Pertinent Vitals/Pain Pain Assessment: No/denies  pain    Home Living Family/patient expects to be discharged to:: Private residence Living Arrangements: Alone Available Help at Discharge: Family;Available PRN/intermittently Type of Home: Apartment Home Access: Level entry     Home Layout: One level Home Equipment: Walker - 4  wheels;Cane - single point      Prior Function Level of Independence: Independent with assistive device(s)         Comments: household and short distanced community ambulator with [facility]     Hand Dominance   Dominant Hand: Right    Extremity/Trunk Assessment   Upper Extremity Assessment Upper Extremity Assessment: Defer to OT evaluation    Lower Extremity Assessment Lower Extremity Assessment: Generalized weakness;RLE deficits/detail;LLE deficits/detail RLE Deficits / Details: grossly -4/5 RLE Sensation: WNL RLE Coordination: WNL LLE Deficits / Details: grossly 3+/5 except hip flexors grossly -3/5, ankle dorsiflexors grossly 3/5 LLE Sensation: decreased light touch LLE Coordination: decreased gross motor    Cervical / Trunk Assessment Cervical / Trunk Assessment: Kyphotic  Communication   Communication: No difficulties  Cognition Arousal/Alertness: Awake/alert Behavior During Therapy: WFL for tasks assessed/performed Overall Cognitive Status: Within Functional Limits for tasks assessed                                        General Comments      Exercises     Assessment/Plan    PT Assessment Patient needs continued PT services  PT Problem List Decreased strength;Decreased activity tolerance;Decreased balance;Decreased mobility       PT Treatment Interventions Gait training;Functional mobility training;Therapeutic activities;Therapeutic exercise;Patient/family education;Balance training;Neuromuscular re-education    PT Goals (Current goals can be found in the Care Plan section)  Acute Rehab PT Goals Patient Stated Goal: return home at [facility] PT Goal Formulation: With patient Time For Goal Achievement: 02/24/19 Potential to Achieve Goals: Good    Frequency Min 5X/week   Barriers to discharge        Co-evaluation               AM-PAC PT "6 Clicks" Mobility  Outcome Measure Help needed turning from your back to your side while in  a flat bed without using bedrails?: A Lot Help needed moving from lying on your back to sitting on the side of a flat bed without using bedrails?: A Lot Help needed moving to and from a bed to a chair (including a wheelchair)?: A Lot Help needed standing up from a chair using your arms (e.g., wheelchair or bedside chair)?: A Lot Help needed to walk in hospital room?: A Lot Help needed climbing 3-5 steps with a railing? : Total 6 Click Score: 11    End of Session   Activity Tolerance: Patient tolerated treatment well;Patient limited by fatigue Patient left: in chair;with call bell/phone within reach Nurse Communication: Mobility status PT Visit Diagnosis: Unsteadiness on feet (R26.81);Other abnormalities of gait and mobility (R26.89);Muscle weakness (generalized) (M62.81)    Time: 8768-1157 PT Time Calculation (min) (ACUTE ONLY): 36 min   Charges:   PT Evaluation $PT Eval Moderate Complexity: 1 Mod PT Treatments $Therapeutic Activity: 23-37 mins        9:55 AM, 02/10/19 Ocie Bob, MPT Physical Therapist with [facility] [phone] office [phone] mobile phone

## 2019-02-10 NOTE — Progress Notes (Signed)
Progress Note  Patient Name: Linda Donaldson Date of Encounter: 02/10/2019  Primary Cardiologist: Dietrich Pates, MD   Subjective   Denies chest pain and shortness of breath. Sitting up having breakfast.  She told me she's been widowed for 16 years and lives alone. 2 sons have since passed away, one after lung transplant surgery and another from colon cancer.  She was driving up until March.  Inpatient Medications    Scheduled Meds:  atorvastatin  80 mg Oral q1800   Chlorhexidine Gluconate Cloth  6 each Topical Daily   clopidogrel  75 mg Oral Daily   dorzolamide-timolol  1 drop Both Eyes BID   feeding supplement (ENSURE ENLIVE)  237 mL Oral BID BM   fluticasone  2 spray Each Nare Daily   influenza vaccine adjuvanted  0.5 mL Intramuscular Tomorrow-1000   latanoprost  1 drop Both Eyes QHS   metoprolol tartrate  12.5 mg Oral BID   pantoprazole  40 mg Oral Daily   potassium chloride  10 mEq Oral BID   trimethoprim  100 mg Oral Daily   Continuous Infusions:  PRN Meds: ondansetron (ZOFRAN) IV   Vital Signs    Vitals:   02/10/19 0500 02/10/19 0600 02/10/19 0700 02/10/19 0730  BP: (!) 104/52 (!) 121/95 98/86   Pulse: 60 67 73 65  Resp: 20 20 17  (!) 22  Temp:    99 F (37.2 C)  TempSrc:    Oral  SpO2: 97% 98% 95% 95%  Weight:      Height:        Intake/Output Summary (Last 24 hours) at 02/10/2019 0917 Last data filed at 02/09/2019 2000 Gross per 24 hour  Intake 440 ml  Output --  Net 440 ml   Filed Weights   02/08/19 2332 02/09/19 0512  Weight: 78.5 kg 81 kg    Telemetry    NSR - Personally Reviewed   Physical Exam   GEN: No acute distress.   Neck: No JVD Cardiac: RRR, no murmurs, rubs, or gallops.  Respiratory: Clear to auscultation bilaterally. GI: Soft, nontender, non-distended  MS: Trace b/l leg edema; No deformity. Neuro:  Nonfocal  Psych: Normal affect   Labs    Chemistry Recent Labs  Lab 02/08/19 2330 02/10/19 0411  NA 136  138  K 3.9 3.6  CL 105 106  CO2 22 23  GLUCOSE 137* 118*  BUN 21 20  CREATININE 0.96 0.99  CALCIUM 9.9 9.5  PROT 7.2 6.5  ALBUMIN 3.8 3.3*  AST 22 29  ALT 9 13  ALKPHOS 49 42  BILITOT 0.4 0.5  GFRNONAA 51* 49*  GFRAA 59* 57*  ANIONGAP 9 9     Hematology Recent Labs  Lab 02/08/19 2330 02/10/19 0411  WBC 8.8 10.6*  RBC 4.48 4.81  HGB 13.8 14.7  HCT 43.0 47.6*  MCV 96.0 99.0  MCH 30.8 30.6  MCHC 32.1 30.9  RDW 12.8 13.0  PLT 213 196    Cardiac EnzymesNo results for input(s): TROPONINI in the last 168 hours. No results for input(s): TROPIPOC in the last 168 hours.   BNP Recent Labs  Lab 02/08/19 2330  BNP 176.0*     DDimer No results for input(s): DDIMER in the last 168 hours.   Radiology    Ct Angio Head W Or Wo Contrast  Result Date: 02/09/2019 CLINICAL DATA:  dizziness and weakness EXAM: CT ANGIOGRAPHY HEAD AND NECK TECHNIQUE: Multidetector CT imaging of the head and neck was performed using  the standard protocol during bolus administration of intravenous contrast. Multiplanar CT image reconstructions and MIPs were obtained to evaluate the vascular anatomy. Carotid stenosis measurements (when applicable) are obtained utilizing NASCET criteria, using the distal internal carotid diameter as the denominator. CONTRAST:  194mL OMNIPAQUE IOHEXOL 350 MG/ML SOLN COMPARISON:  MRA 10/02/2012. FINDINGS: CTA NECK FINDINGS SKELETON: There is no bony spinal canal stenosis. No lytic or blastic lesion. OTHER NECK: Normal pharynx, larynx and major salivary glands. No cervical lymphadenopathy. Unremarkable thyroid gland. UPPER CHEST: No pneumothorax or pleural effusion. No nodules or masses. AORTIC ARCH: There is mild calcific atherosclerosis of the aortic arch. There is no aneurysm, dissection or hemodynamically significant stenosis of the visualized portion of the aorta. Conventional 3 vessel aortic branching pattern. The visualized proximal subclavian arteries are widely patent.  RIGHT CAROTID SYSTEM: No dissection, occlusion or aneurysm. Mild atherosclerotic calcification at the carotid bifurcation without hemodynamically significant stenosis. LEFT CAROTID SYSTEM: No dissection, occlusion or aneurysm. Mild atherosclerotic calcification at the carotid bifurcation without hemodynamically significant stenosis. VERTEBRAL ARTERIES: Left dominant configuration. Both origins are clearly patent. The right vertebral artery V3 segment is occluded, as it was on 10/02/2012. The left vertebral artery is normal. CTA HEAD FINDINGS POSTERIOR CIRCULATION: --Vertebral arteries: Both V4 segments are opacified. --Posterior inferior cerebellar arteries (PICA): Patent origins from the vertebral arteries. --Anterior inferior cerebellar arteries (AICA): Patent origins from the basilar artery. --Basilar artery: Normal. --Superior cerebellar arteries: Normal. --Posterior cerebral arteries: Normal. Both originate from the basilar artery. Posterior communicating arteries (p-comm) are diminutive or absent. ANTERIOR CIRCULATION: --Intracranial internal carotid arteries: Atherosclerotic calcification of the internal carotid arteries at the skull base without hemodynamically significant stenosis. --Anterior cerebral arteries (ACA): Normal. Both A1 segments are present. Patent anterior communicating artery (a-comm). --Middle cerebral arteries (MCA): Normal. VENOUS SINUSES: As permitted by contrast timing, patent. ANATOMIC VARIANTS: None Review of the MIP images confirms the above findings. IMPRESSION: 1. No intracranial arterial occlusion or high-grade stenosis. 2. Occlusion of the right vertebral artery V3 segment, which was occluded on the MRA of 10/02/2012 3. Aortic Atherosclerosis (ICD10-I70.0). Electronically Signed   By: Ulyses Jarred M.D.   On: 02/09/2019 02:50   Dg Chest 2 View  Result Date: 02/09/2019 CLINICAL DATA:  Weakness EXAM: CHEST - 2 VIEW COMPARISON:  05/27/2008 FINDINGS: No pleural effusion. Diffuse  coarse interstitial opacity with mild reticulation suggesting component of chronic interstitial lung disease. Streaky opacities in the upper lobes and left base. Normal cardiomediastinal silhouette. No pneumothorax. IMPRESSION: 1. Diffuse coarse and mildly reticular interstitial opacity, suspect for component of chronic interstitial lung disease. Streaky opacities are present within the right greater than left upper lobe and the left lung base and may reflect acute superimposed pneumonia. Electronically Signed   By: Donavan Foil M.D.   On: 02/09/2019 01:17   Ct Head Wo Contrast  Result Date: 02/09/2019 CLINICAL DATA:  Vertigo, episodic EXAM: CT HEAD WITHOUT CONTRAST TECHNIQUE: Contiguous axial images were obtained from the base of the skull through the vertex without intravenous contrast. COMPARISON:  None. FINDINGS: Brain: No evidence of acute territorial infarction, hemorrhage, hydrocephalus,extra-axial collection or mass lesion/mass effect. There is dilatation the ventricles and sulci consistent with age-related atrophy. Low-attenuation changes in the deep white matter consistent with small vessel ischemia. Vascular: No hyperdense vessel or unexpected calcification. Skull: The skull is intact. No fracture or focal lesion identified. Sinuses/Orbits: The visualized paranasal sinuses and mastoid air cells are clear. The orbits and globes intact. Other: None IMPRESSION: No acute intracranial abnormality. Findings consistent  with age related atrophy and chronic small vessel ischemia Electronically Signed   By: Jonna Clark M.D.   On: 02/09/2019 01:03   Ct Angio Neck W And/or Wo Contrast  Result Date: 02/09/2019 CLINICAL DATA:  dizziness and weakness EXAM: CT ANGIOGRAPHY HEAD AND NECK TECHNIQUE: Multidetector CT imaging of the head and neck was performed using the standard protocol during bolus administration of intravenous contrast. Multiplanar CT image reconstructions and MIPs were obtained to evaluate the  vascular anatomy. Carotid stenosis measurements (when applicable) are obtained utilizing NASCET criteria, using the distal internal carotid diameter as the denominator. CONTRAST:  OMNIPAQUE IOHEXOL 350 MG/ML SOLN COMPARISON:  MRA 10/02/2012. FINDINGS: CTA NECK FINDINGS SKELETON: There is no bony spinal canal stenosis. No lytic or blastic lesion. OTHER NECK: Normal pharynx, larynx and major salivary glands. No cervical lymphadenopathy. Unremarkable thyroid gland. UPPER CHEST: No pneumothorax or pleural effusion. No nodules or masses. AORTIC ARCH: There is mild calcific atherosclerosis of the aortic arch. There is no aneurysm, dissection or hemodynamically significant stenosis of the visualized portion of the aorta. Conventional 3 vessel aortic branching pattern. The visualized proximal subclavian arteries are widely patent. RIGHT CAROTID SYSTEM: No dissection, occlusion or aneurysm. Mild atherosclerotic calcification at the carotid bifurcation without hemodynamically significant stenosis. LEFT CAROTID SYSTEM: No dissection, occlusion or aneurysm. Mild atherosclerotic calcification at the carotid bifurcation without hemodynamically significant stenosis. VERTEBRAL ARTERIES: Left dominant configuration. Both origins are clearly patent. The right vertebral artery V3 segment is occluded, as it was on 10/02/2012. The left vertebral artery is normal. CTA HEAD FINDINGS POSTERIOR CIRCULATION: --Vertebral arteries: Both V4 segments are opacified. --Posterior inferior cerebellar arteries (PICA): Patent origins from the vertebral arteries. --Anterior inferior cerebellar arteries (AICA): Patent origins from the basilar artery. --Basilar artery: Normal. --Superior cerebellar arteries: Normal. --Posterior cerebral arteries: Normal. Both originate from the basilar artery. Posterior communicating arteries (p-comm) are diminutive or absent. ANTERIOR CIRCULATION: --Intracranial internal carotid arteries: Atherosclerotic  calcification of the internal carotid arteries at the skull base without hemodynamically significant stenosis. --Anterior cerebral arteries (ACA): Normal. Both A1 segments are present. Patent anterior communicating artery (a-comm). --Middle cerebral arteries (MCA): Normal. VENOUS SINUSES: As permitted by contrast timing, patent. ANATOMIC VARIANTS: None Review of the MIP images confirms the above findings. IMPRESSION: 1. No intracranial arterial occlusion or high-grade stenosis. 2. Occlusion of the right vertebral artery V3 segment, which was occluded on the MRA of 10/02/2012 3. Aortic Atherosclerosis (ICD10-I70.0). Electronically Signed   By: Deatra Robinson M.D.   On: 02/09/2019 02:50   Linda Donaldson Contrast  Result Date: 02/09/2019 CLINICAL DATA:  Ataxia, stroke suspected. Additional history: Vertigo x 2 days EXAM: MRI HEAD WITHOUT AND WITH CONTRAST TECHNIQUE: Multiplanar, multiecho pulse sequences of the brain and surrounding structures were obtained without and with intravenous contrast. CONTRAST:  5mL GADAVIST GADOBUTROL 1 MMOL/ML IV SOLN COMPARISON:  Non-contrast CT head and CT angiogram head/neck 02/09/2019, brain MRI 10/02/2012 (images unavailable), FINDINGS: Brain: Multiple sequences are motion degraded, limiting evaluation There is a 1.4 x 0.8 cm focus of restricted diffusion within the right pons consistent with acute infarction. Corresponding T2/FLAIR hyperintensity at this site. Advanced chronic small vessel ischemic disease with small bilateral chronic basal ganglia and thalamic lacunar infarcts, progressed from prior MRI 10/02/2012. No evidence of intracranial mass. No midline shift or extra-axial fluid collection. No chronic intracranial blood products. Mild generalized parenchymal atrophy. No abnormal intracranial enhancement is identified. Vascular: Flow voids maintained within the proximal large arterial vessels. Skull and upper cervical spine: No  focal marrow lesion Sinuses/Orbits: Visualized  orbits demonstrate no acute abnormality. Mild ethmoid sinus mucosal thickening. Visualized mastoid air cells are clear. These results were called by telephone at the time of interpretation on 02/09/2019 at 1:19 pm to provider [facility]FI DOONQUAH , who verbally acknowledged these results. IMPRESSION: 1. Motion degraded examination. 2. 1.4 x 0.8 cm acute right pontine infarct. 3. Advanced chronic small vessel ischemic disease with bilateral chronic basal ganglia/thalamic lacunar infarcts, progressed from prior MRI 10/02/2012. 4. Mild generalized parenchymal atrophy. Electronically Signed   By: Jackey LogeKyle  Golden   On: 02/09/2019 13:20    Cardiac Studies   Echocardiogram 02/09/19:   1. Left ventricular ejection fraction, by visual estimation, is 35 to 40%. The left ventricle has moderately decreased function.  2. Difficult acoustic windows. LVEF is depressed at approximately 35 to 40% with severe hypokinesis/akinesis of the distal anterior, mid/distal anterolateral, distal inferior and apical walls.  3. Global right ventricle has normal systolic function.The right ventricular size is normal. No increase in right ventricular wall thickness.  4. Left atrial size was severely dilated.  5. Right atrial size was normal.  6. Mild mitral annular calcification.  7. The mitral valve is abnormal. Trace mitral valve regurgitation.  8. The tricuspid valve is normal in structure. Tricuspid valve regurgitation mild-moderate.  9. The aortic valve is tricuspid Aortic valve regurgitation was not visualized by color flow Doppler. Mild aortic valve sclerosis without stenosis. 10. The pulmonic valve was not well visualized. Pulmonic valve regurgitation is trivial by color flow Doppler 11. Severely elevated pulmonary artery systolic pressure(>60 mm Hg).  Patient Profile     85 y.o. female with a hx of right vetebral stenosis who is being seen today for the evaluation of NSTEMI at the request of Dr. Lucianne MussKumar.  Assessment & Plan     1. NSTEMI: LVEF 35-40%. HS troponins peaked at 2,559. Intolerant of ASA. Currently on Plavix and atorvastatin given acute CVA. Denies anginal symptoms. Would treat medically for now. I will switch Lopressor to Toprol-XL 12.5 mg bid given depressed LVEF. BP too low to add ACEI/ARB. LDL 74. TSH and A1C are normal. I plan to manage medically.   2. Cardiomyopathy: Ischemic in nature, LVEF 35-40%.  I will switch Lopressor to Toprol-XL 12.5 mg bid given depressed LVEF.  3. Acute right pontine CVA: On Plavix and statin.  4. Diarrhea: Negative for C. Diff.    For questions or updates, please contact [facility] Please consult [email or web address] for contact info under Cardiology/STEMI.      Signed, Prentice DockerSuresh Victoria Euceda, MD  02/10/2019, 9:17 AM

## 2019-02-11 NOTE — TOC Progression Note (Addendum)
Transition of Care [facility]) - Progression Note    Patient Details  Name: Linda Donaldson MRN: [id number] Date of Birth: 1922/09/12  [facility] CM/SW Contact  Nairi Oswald, Chauncey Reading, RN Phone Number: 02/11/2019, 12:59 PM  Clinical Narrative:   Gilda Crease called and wants to extend SNF search to some [facility] facilities. Referrals sent.   ADDENDUM: Discussed all bed offers with grand daughter. She wants to talk with patient and decide, she is to let CM know decision by 4:30 today.    Expected Discharge Plan: [place] Barriers to Discharge: Continued Medical Work up, SNF Pending bed offer  Expected Discharge Plan and Services Expected Discharge Plan: [place]   Discharge Planning Services: CM Consult Post Acute Care Choice: Nursing Home Living arrangements for the past 2 months: [place]                                       Social Determinants of Health (SDOH) Interventions    Readmission Risk Interventions No flowsheet data found.

## 2019-02-11 NOTE — Evaluation (Signed)
Physical Therapy Evaluation Patient Details Name: Linda Donaldson MRN: [id number] DOB: Feb 17, 1923 Today's Date: 02/11/2019   History of Present Illness  85 year old woman ADL/IADL independent with past medical history is reviewed in the EMR was brought to the ER with complaint of generalized weakness and dizziness.  As per patient she has not been feeling well throughout on 02/08/2019.  She states that yesterday evening around 6 PM she was walking with her walker when her legs gave way and she leaned against a TV cabinet and lowered herself down to the ground without hitting her head.  She had no loss of consciousness no bladder or bowel incontinence.  No blackout, any preceding dizziness or headache.  At the time of my interview patient stated that she has been having these symptoms for past 8 to 10 days which she describes as a feeling of unwell'' something passing through her body''.  She had no complaint of fever, chill, cough, congestion, nausea, vomiting, diarrhea, chest pain, shortness of breath, palpitation, headache and blurring of vision.  No tingling or numbness sensation at the time of interview.  As she complained of acute weakness in her left leg and was symptomatic for strokelike features code stroke was called.  Clinical Impression  PT eager to work with therapist.  PT able to use all major mm groups in both UE and LE on LT side although significantly weaker than her RT.  PT would like to eventually return to her apartment but realizes that she will need to work on strengthening as well ambulation prior to this.  PT will continue to need skilled PT, OT and speech.      Follow Up Recommendations SNF    Equipment Recommendations  None recommended by PT    Recommendations for Other Services   SP PT complains of food feeling like it is stuck.     Precautions / Restrictions Precautions Precautions: Fall Restrictions Weight Bearing Restrictions: No         Pertinent Vitals/Pain  Pain Assessment: No/denies pain    Home Living Family/patient expects to be discharged to:: Skilled nursing facility Living Arrangements: Alone Available Help at Discharge: Family;Available PRN/intermittently Type of Home: Apartment Home Access: Level entry     Home Layout: One level Home Equipment: Walker - 4 wheels;Cane - single point      Prior Function Level of Independence: Independent with assistive device(s)         Comments: household and short distanced community ambulator with [facility]     Hand Dominance   Dominant Hand: Right    Extremity/Trunk Assessment        Lower Extremity Assessment RLE Deficits / Details: grossly 3-/5 for hip and ankle ; 4- for knee RLE Sensation: WNL RLE Coordination: WNL LLE Deficits / Details: grossly 3+/5 except hip flexors grossly -3/5, ankle dorsiflexors grossly 3/5 LLE Sensation: decreased light touch LLE Coordination: decreased gross motor    Cervical / Trunk Assessment Cervical / Trunk Assessment: Kyphotic  Communication   Communication: No difficulties  Cognition Arousal/Alertness: Awake/alert Behavior During Therapy: WFL for tasks assessed/performed Overall Cognitive Status: Within Functional Limits for tasks assessed                                           Exercises General Exercises - Upper Extremity Shoulder Flexion: Both;5 reps Elbow Flexion: Left;10 reps Elbow Extension: Left;10 reps Wrist Flexion: Left;10  reps Digit Composite Flexion: Left;10 reps Composite Extension: 10 reps General Exercises - Lower Extremity Ankle Circles/Pumps: Both;20 reps Quad Sets: Both;10 reps Gluteal Sets: Both;10 reps Heel Slides: Both;10 reps Hip ABduction/ADduction: Both;10 reps Hip Flexion/Marching: Both;5 reps   Assessment/Plan    PT Assessment Patient needs continued PT services  PT Problem List Decreased strength;Decreased activity tolerance;Decreased balance;Decreased mobility       PT Treatment  Interventions Gait training;Functional mobility training;Therapeutic activities;Therapeutic exercise;Patient/family education;Balance training;Neuromuscular re-education    PT Goals (Current goals can be found in the Care Plan section)  Acute Rehab PT Goals Patient Stated Goal: return home at [facility] PT Goal Formulation: With patient Potential to Achieve Goals: Poor(pt will need SNF will not be able to go home from this facility)    Frequency Min 5X/week   Barriers to discharge           AM-PAC PT "6 Clicks" Mobility  Outcome Measure Help needed turning from your back to your side while in a flat bed without using bedrails?: A Lot Help needed moving from lying on your back to sitting on the side of a flat bed without using bedrails?: A Lot Help needed moving to and from a bed to a chair (including a wheelchair)?: A Lot Help needed standing up from a chair using your arms (e.g., wheelchair or bedside chair)?: A Lot Help needed to walk in hospital room?: A Lot Help needed climbing 3-5 steps with a railing? : Total 6 Click Score: 11    End of Session   Activity Tolerance: Patient limited by fatigue Patient left: in bed(Due to pt needing cleaning) Nurse Communication: Mobility status PT Visit Diagnosis: Unsteadiness on feet (R26.81);Other abnormalities of gait and mobility (R26.89);Muscle weakness (generalized) (M62.81)    Time: 8280-0349 PT Time Calculation (min) (ACUTE ONLY): 40 min   Charges:     PT Treatments $Therapeutic Exercise: 38-52 mins          Virgina Organ, PT CLT [phone] 02/11/2019, 10:06 AM

## 2019-02-11 NOTE — Progress Notes (Signed)
Patient Demographics:    Linda Donaldson, is a 85 y.o. female, DOB - 03/04/1923, ZOX:[id number]  Admit date - 02/08/2019   Admitting Physician Kirstie PeriNavneet Kumar, MD  Outpatient Primary MD for the patient is Elias Elseeade, Robert, MD  LOS - 2   Chief Complaint  Patient presents with   Weakness        Subjective:    Linda Donaldson today has no fevers, no emesis,  No chest pain, no new concerns, -  Assessment  & Plan :    Principal Problem:   Stroke-like symptoms Active Problems:   GERD   Lower extremity edema   Hyperlipidemia   NSTEMI (non-ST elevated myocardial infarction) (HCC)   Stroke-like symptom   Acute CVA (cerebrovascular accident) (HCC)   Cardiomyopathy (HCC)   Diarrhea  Brief Summary:- 85 year old woman ADL/IADL independent admitted from independent living apartment admitted on 02/09/2019 with strokelike symptoms and elevated troponin-  -MRI brain consistent with acute stroke -Awaiting bed availability and insurance approval for transfer to SNF rehab   A/p 1)1.4 x 0.8 cm acute right pontine infarct.- MRI brain confirms acute pontine stroke, CT head and CTA neck without LVO -Echocardiogram with EF of 35 to 40%, with severe hypokinesis/akinesis of the distal anterior, mid/distal anterolateral, distal inferior and apical walls -  neurology consult from Dr Gerilyn Pilgr[place]quah appreciated -Speech and physical therapy eval appreciated -LDL 74 with HDL of 54,, A1c is 5.5 and TSH is 2.0 -PTA patient was on pravastatin 40 mg daily, changed to Lipitor 80 mg daily Even if his lipid panel is within desired limits, patient should still take Lipitor/Statin for it's Pleiotropic effects (beyond cholesterol lowering benefits) -PTA patient was not on antiplatelet agents (intolerant of aspirin) c/n  Plavix 75 mg daily -Generalized weakness persist, subtle left-sided weakness persist, -Physical therapist recommends SNF  rehab  2)NSTEMI-  elevated troponin in the setting of acute stroke--- ?? NSTEMI-  echocardiogram with reduced EF (35 to 40 %) and significant wall motion abnormalities as above #1-Troponin 2547 previous level 2559 -Continue Toprol-XL 12.5 mg daily , c/n Lipitor as above, Plavix as above -Chest pain-free -Cardiology consult appreciated  3)Nausea /vomiting and diarrhea--- multiple episodes of loose stools,  stool for C. difficile is negative and stool for GI pathogen is pending . Zofran PRN nausea vomiting -Hold Lasix due to concerns about fluid losses and risk for dehydration -Diarrhea and nausea has resolved  4)HFrEF/ischemic cardiomyopathy--- EF 35 to 40% as noted in #1 above hold Lasix, use TEDs for LE edema -Toprol-XL as above #2  5)Social/Ethics--DNR/DNI, plan of care and goals of care discussed with patient and POA/great-granddaughter Tiffany at bedside  Disposition/Need for in-Hospital Stay- patient unable to be discharged at this time due to -acute stroke, acute MI, -Awaiting bed availability and insurance approval for transfer to SNF rehab*  Code Status : DNR  Family Communication:    (patient is alert, awake and coherent) -POA/great-granddaughter Tiffany at bedside  Disposition Plan  : SNF  Consults  : Neurology/cardiology  DVT Prophylaxis  :   - Heparin - SCDs   Lab Results  Component Value Date   PLT 196 02/10/2019   Inpatient Medications  Scheduled Meds:  atorvastatin  80 mg Oral q1800   Chlorhexidine Gluconate Cloth  6 each  Topical Daily   clopidogrel  75 mg Oral Daily   dorzolamide-timolol  1 drop Both Eyes BID   feeding supplement (ENSURE ENLIVE)  237 mL Oral BID BM   fluticasone  2 spray Each Nare Daily   heparin injection (subcutaneous)  5,000 Units Subcutaneous Q8H   latanoprost  1 drop Both Eyes QHS   metoprolol succinate  12.5 mg Oral BID   multivitamin with minerals  1 tablet Oral Daily   pantoprazole  40 mg Oral Daily   potassium  chloride  10 mEq Oral BID   trimethoprim  100 mg Oral Daily   Continuous Infusions: PRN Meds:.ondansetron (ZOFRAN) IV  Anti-infectives (From admission, onward)   Start     Dose/Rate Route Frequency Ordered Stop   02/09/19 1000  trimethoprim (TRIMPEX) tablet 100 mg     100 mg Oral Daily 02/09/19 0508         Objective:   Vitals:   02/11/19 1000 02/11/19 1100 02/11/19 1200 02/11/19 1300  BP: (!) 121/59 (!) 112/44 (!) 100/44   Pulse: 91 65 64 73  Resp: 18 (!) 22 (!) 21 (!) 23  Temp:      TempSrc:      SpO2: 97%     Weight:      Height:        Wt Readings from Last 3 Encounters:  02/11/19 85.2 kg  02/24/13 82.1 kg  09/11/12 84.8 kg    Intake/Output Summary (Last 24 hours) at 02/11/2019 1335 Last data filed at 02/11/2019 1100 Gross per 24 hour  Intake 360 ml  Output 450 ml  Net -90 ml    Physical Exam Gen:- Awake Alert,  In no apparent distress  HEENT:- [place].AT, No sclera icterus Eyes-diplopia and visual symptoms improving Neck-Supple Neck,No JVD,.  Lungs-  CTAB , fair symmetrical air movement CV- S1, S2 normal, regular  Abd-  +ve B.Sounds, Abd Soft, No tenderness,    Extremity/Skin:- 1+ve  edema, pedal pulses present  Psych-affect is appropriate, oriented x3 Neuro-generalized weakness, facial asymmetry, subtle left-sided weakness, unsteady gait/ataxia, no tremors   Data Review:   Micro Results Recent Results (from the past 240 hour(s))  Blood culture (routine x 2)     Status: None (Preliminary result)   Collection Time: 02/09/19 12:23 AM   Specimen: BLOOD LEFT FOREARM  Result Value Ref Range Status   Specimen Description BLOOD LEFT FOREARM  Final   Special Requests   Final    BOTTLES DRAWN AEROBIC AND ANAEROBIC Blood Culture adequate volume   Culture   Final    NO GROWTH 2 DAYS Performed at [facility], [address], [place], [place] [address]    Report Status PENDING  Incomplete  Blood culture (routine x 2)     Status: None (Preliminary result)    Collection Time: 02/09/19 12:35 AM   Specimen: BLOOD RIGHT HAND  Result Value Ref Range Status   Specimen Description BLOOD RIGHT HAND  Final   Special Requests   Final    BOTTLES DRAWN AEROBIC AND ANAEROBIC Blood Culture adequate volume   Culture   Final    NO GROWTH 2 DAYS Performed at [facility], [address] [place], [place] [address]    Report Status PENDING  Incomplete  SARS CORONAVIRUS 2 (TAT 6-24 HRS) Nasopharyngeal Nasopharyngeal Swab     Status: None   Collection Time: 02/09/19  4:19 AM   Specimen: Nasopharyngeal Swab  Result Value Ref Range Status   SARS Coronavirus 2 NEGATIVE NEGATIVE  Final    Comment: (NOTE) SARS-CoV-2 target nucleic acids are NOT DETECTED. The SARS-CoV-2 RNA is generally detectable in upper and lower respiratory specimens during the acute phase of infection. Negative results do not preclude SARS-CoV-2 infection, do not rule out co-infections with other pathogens, and should not be used as the sole basis for treatment or other patient management decisions. Negative results must be combined with clinical observations, patient history, and epidemiological information. The expected result is Negative. Fact Sheet for Patients: HairSlick.no Fact Sheet for Healthcare Providers: quierodirigir.com This test is not yet approved or cleared by the [place] FDA and  has been authorized for detection and/or diagnosis of SARS-CoV-2 by FDA under an Emergency Use Authorization (EUA). This EUA will remain  in effect (meaning this test can be used) for the duration of the COVID-19 declaration under Section 56 4(b)(1) of the Act, 21 U.S.C. section 360bbb-3(b)(1), unless the authorization is terminated or revoked sooner. Performed at [facility], [address] [place], [place] [address]   MRSA PCR Screening     Status: None   Collection Time: 02/09/19  5:07 AM   Specimen: Nasal Mucosa;  Nasopharyngeal  Result Value Ref Range Status   MRSA by PCR NEGATIVE NEGATIVE Final    Comment:        The GeneXpert MRSA Assay (FDA approved for NASAL specimens only), is one component of a comprehensive MRSA colonization surveillance program. It is not intended to diagnose MRSA infection nor to guide or monitor treatment for MRSA infections. Performed at [facility], [address], [place], [place] [address]   C difficile quick scan w PCR reflex     Status: None   Collection Time: 02/09/19  4:28 PM   Specimen: STOOL  Result Value Ref Range Status   C Diff antigen NEGATIVE NEGATIVE Final   C Diff toxin NEGATIVE NEGATIVE Final   C Diff interpretation No C. difficile detected.  Final    Comment: Performed at [facility], [address], [place], [place] [address]   Radiology Reports Ct Angio Head W Or Wo Contrast  Result Date: 02/09/2019 CLINICAL DATA:  dizziness and weakness EXAM: CT ANGIOGRAPHY HEAD AND NECK TECHNIQUE: Multidetector CT imaging of the head and neck was performed using the standard protocol during bolus administration of intravenous contrast. Multiplanar CT image reconstructions and MIPs were obtained to evaluate the vascular anatomy. Carotid stenosis measurements (when applicable) are obtained utilizing NASCET criteria, using the distal internal carotid diameter as the denominator. CONTRAST:  OMNIPAQUE IOHEXOL 350 MG/ML SOLN COMPARISON:  MRA 10/02/2012. FINDINGS: CTA NECK FINDINGS SKELETON: There is no bony spinal canal stenosis. No lytic or blastic lesion. OTHER NECK: Normal pharynx, larynx and major salivary glands. No cervical lymphadenopathy. Unremarkable thyroid gland. UPPER CHEST: No pneumothorax or pleural effusion. No nodules or masses. AORTIC ARCH: There is mild calcific atherosclerosis of the aortic arch. There is no aneurysm, dissection or hemodynamically significant stenosis of the visualized portion of the aorta. Conventional 3 vessel aortic branching  pattern. The visualized proximal subclavian arteries are widely patent. RIGHT CAROTID SYSTEM: No dissection, occlusion or aneurysm. Mild atherosclerotic calcification at the carotid bifurcation without hemodynamically significant stenosis. LEFT CAROTID SYSTEM: No dissection, occlusion or aneurysm. Mild atherosclerotic calcification at the carotid bifurcation without hemodynamically significant stenosis. VERTEBRAL ARTERIES: Left dominant configuration. Both origins are clearly patent. The right vertebral artery V3 segment is occluded, as it was on 10/02/2012. The left vertebral artery is normal. CTA HEAD FINDINGS POSTERIOR CIRCULATION: --Vertebral arteries:  Both V4 segments are opacified. --Posterior inferior cerebellar arteries (PICA): Patent origins from the vertebral arteries. --Anterior inferior cerebellar arteries (AICA): Patent origins from the basilar artery. --Basilar artery: Normal. --Superior cerebellar arteries: Normal. --Posterior cerebral arteries: Normal. Both originate from the basilar artery. Posterior communicating arteries (p-comm) are diminutive or absent. ANTERIOR CIRCULATION: --Intracranial internal carotid arteries: Atherosclerotic calcification of the internal carotid arteries at the skull base without hemodynamically significant stenosis. --Anterior cerebral arteries (ACA): Normal. Both A1 segments are present. Patent anterior communicating artery (a-comm). --Middle cerebral arteries (MCA): Normal. VENOUS SINUSES: As permitted by contrast timing, patent. ANATOMIC VARIANTS: None Review of the MIP images confirms the above findings. IMPRESSION: 1. No intracranial arterial occlusion or high-grade stenosis. 2. Occlusion of the right vertebral artery V3 segment, which was occluded on the MRA of 10/02/2012 3. Aortic Atherosclerosis (ICD10-I70.0). Electronically Signed   By: Deatra Robinson M.D.   On: 02/09/2019 02:50   Dg Chest 2 View  Result Date: 02/09/2019 CLINICAL DATA:  Weakness EXAM: CHEST -  2 VIEW COMPARISON:  05/27/2008 FINDINGS: No pleural effusion. Diffuse coarse interstitial opacity with mild reticulation suggesting component of chronic interstitial lung disease. Streaky opacities in the upper lobes and left base. Normal cardiomediastinal silhouette. No pneumothorax. IMPRESSION: 1. Diffuse coarse and mildly reticular interstitial opacity, suspect for component of chronic interstitial lung disease. Streaky opacities are present within the right greater than left upper lobe and the left lung base and may reflect acute superimposed pneumonia. Electronically Signed   By: Jasmine Pang M.D.   On: 02/09/2019 01:17   Ct Head Wo Contrast  Result Date: 02/09/2019 CLINICAL DATA:  Vertigo, episodic EXAM: CT HEAD WITHOUT CONTRAST TECHNIQUE: Contiguous axial images were obtained from the base of the skull through the vertex without intravenous contrast. COMPARISON:  None. FINDINGS: Brain: No evidence of acute territorial infarction, hemorrhage, hydrocephalus,extra-axial collection or mass lesion/mass effect. There is dilatation the ventricles and sulci consistent with age-related atrophy. Low-attenuation changes in the deep white matter consistent with small vessel ischemia. Vascular: No hyperdense vessel or unexpected calcification. Skull: The skull is intact. No fracture or focal lesion identified. Sinuses/Orbits: The visualized paranasal sinuses and mastoid air cells are clear. The orbits and globes intact. Other: None IMPRESSION: No acute intracranial abnormality. Findings consistent with age related atrophy and chronic small vessel ischemia Electronically Signed   By: Jonna Clark M.D.   On: 02/09/2019 01:03   Ct Angio Neck W And/or Wo Contrast  Result Date: 02/09/2019 CLINICAL DATA:  dizziness and weakness EXAM: CT ANGIOGRAPHY HEAD AND NECK TECHNIQUE: Multidetector CT imaging of the head and neck was performed using the standard protocol during bolus administration of intravenous contrast.  Multiplanar CT image reconstructions and MIPs were obtained to evaluate the vascular anatomy. Carotid stenosis measurements (when applicable) are obtained utilizing NASCET criteria, using the distal internal carotid diameter as the denominator. CONTRAST:  OMNIPAQUE IOHEXOL 350 MG/ML SOLN COMPARISON:  MRA 10/02/2012. FINDINGS: CTA NECK FINDINGS SKELETON: There is no bony spinal canal stenosis. No lytic or blastic lesion. OTHER NECK: Normal pharynx, larynx and major salivary glands. No cervical lymphadenopathy. Unremarkable thyroid gland. UPPER CHEST: No pneumothorax or pleural effusion. No nodules or masses. AORTIC ARCH: There is mild calcific atherosclerosis of the aortic arch. There is no aneurysm, dissection or hemodynamically significant stenosis of the visualized portion of the aorta. Conventional 3 vessel aortic branching pattern. The visualized proximal subclavian arteries are widely patent. RIGHT CAROTID SYSTEM: No dissection, occlusion or aneurysm. Mild atherosclerotic calcification at the carotid  bifurcation without hemodynamically significant stenosis. LEFT CAROTID SYSTEM: No dissection, occlusion or aneurysm. Mild atherosclerotic calcification at the carotid bifurcation without hemodynamically significant stenosis. VERTEBRAL ARTERIES: Left dominant configuration. Both origins are clearly patent. The right vertebral artery V3 segment is occluded, as it was on 10/02/2012. The left vertebral artery is normal. CTA HEAD FINDINGS POSTERIOR CIRCULATION: --Vertebral arteries: Both V4 segments are opacified. --Posterior inferior cerebellar arteries (PICA): Patent origins from the vertebral arteries. --Anterior inferior cerebellar arteries (AICA): Patent origins from the basilar artery. --Basilar artery: Normal. --Superior cerebellar arteries: Normal. --Posterior cerebral arteries: Normal. Both originate from the basilar artery. Posterior communicating arteries (p-comm) are diminutive or absent. ANTERIOR  CIRCULATION: --Intracranial internal carotid arteries: Atherosclerotic calcification of the internal carotid arteries at the skull base without hemodynamically significant stenosis. --Anterior cerebral arteries (ACA): Normal. Both A1 segments are present. Patent anterior communicating artery (a-comm). --Middle cerebral arteries (MCA): Normal. VENOUS SINUSES: As permitted by contrast timing, patent. ANATOMIC VARIANTS: None Review of the MIP images confirms the above findings. IMPRESSION: 1. No intracranial arterial occlusion or high-grade stenosis. 2. Occlusion of the right vertebral artery V3 segment, which was occluded on the MRA of 10/02/2012 3. Aortic Atherosclerosis (ICD10-I70.0). Electronically Signed   By: Deatra Robinson M.D.   On: 02/09/2019 02:50   Mr Laqueta Jean ZO Contrast  Result Date: 02/09/2019 CLINICAL DATA:  Ataxia, stroke suspected. Additional history: Vertigo x 2 days EXAM: MRI HEAD WITHOUT AND WITH CONTRAST TECHNIQUE: Multiplanar, multiecho pulse sequences of the brain and surrounding structures were obtained without and with intravenous contrast. CONTRAST:  5mL GADAVIST GADOBUTROL 1 MMOL/ML IV SOLN COMPARISON:  Non-contrast CT head and CT angiogram head/neck 02/09/2019, brain MRI 10/02/2012 (images unavailable), FINDINGS: Brain: Multiple sequences are motion degraded, limiting evaluation There is a 1.4 x 0.8 cm focus of restricted diffusion within the right pons consistent with acute infarction. Corresponding T2/FLAIR hyperintensity at this site. Advanced chronic small vessel ischemic disease with small bilateral chronic basal ganglia and thalamic lacunar infarcts, progressed from prior MRI 10/02/2012. No evidence of intracranial mass. No midline shift or extra-axial fluid collection. No chronic intracranial blood products. Mild generalized parenchymal atrophy. No abnormal intracranial enhancement is identified. Vascular: Flow voids maintained within the proximal large arterial vessels. Skull and  upper cervical spine: No focal marrow lesion Sinuses/Orbits: Visualized orbits demonstrate no acute abnormality. Mild ethmoid sinus mucosal thickening. Visualized mastoid air cells are clear. These results were called by telephone at the time of interpretation on 02/09/2019 at 1:19 pm to provider [facility] , who verbally acknowledged these results. IMPRESSION: 1. Motion degraded examination. 2. 1.4 x 0.8 cm acute right pontine infarct. 3. Advanced chronic small vessel ischemic disease with bilateral chronic basal ganglia/thalamic lacunar infarcts, progressed from prior MRI 10/02/2012. 4. Mild generalized parenchymal atrophy. Electronically Signed   By: Jackey Loge   On: 02/09/2019 13:20    CBC Recent Labs  Lab 02/08/19 2330 02/10/19 0411  WBC 8.8 10.6*  HGB 13.8 14.7  HCT 43.0 47.6*  PLT 213 196  MCV 96.0 99.0  MCH 30.8 30.6  MCHC 32.1 30.9  RDW 12.8 13.0  LYMPHSABS 1.9  --   MONOABS 0.9  --   EOSABS 0.2  --   BASOSABS 0.0  --    Chemistries  Recent Labs  Lab 02/08/19 2330 02/10/19 0411  NA 136 138  K 3.9 3.6  CL 105 106  CO2 22 23  GLUCOSE 137* 118*  BUN 21 20  CREATININE 0.96 0.99  CALCIUM 9.9 9.5  AST 22 29  ALT 9 13  ALKPHOS 49 42  BILITOT 0.4 0.5   ----------------------------------------------------------------------------------------------------------------- Recent Labs    02/09/19 0659  CHOL 151  HDL 54  LDLCALC 74  TRIG 116  CHOLHDL 2.8   Lab Results  Component Value Date   HGBA1C 5.5 02/09/2019   ------------------------------------------------------------------------------------------------------------------ Recent Labs    02/09/19 0659  TSH 2.043   ----------------------------------------------------------------------------------------------------------------- No results for input(s): VITAMINB12, FOLATE, FERRITIN, TIBC, IRON, RETICCTPCT in the last 72 hours.  Coagulation profile No results for input(s): INR, PROTIME in the last 168  hours.  No results for input(s): DDIMER in the last 72 hours.  Cardiac Enzymes No results for input(s): CKMB, TROPONINI, MYOGLOBIN in the last 168 hours.  Invalid input(s): CK ------------------------------------------------------------------------------------------------------------------    Component Value Date/Time   BNP 176.0 (H) 02/08/2019 2330   Roxan Hockey M.D on 02/11/2019 at 1:35 PM  Go to [email or web address] - for contact info  [facility] - Office  [phone]

## 2019-02-12 LAB — GI PATHOGEN PANEL BY PCR, STOOL

## 2019-02-12 MED ORDER — FLUTICASONE PROPIONATE 50 MCG/ACT NA SUSP: 1.0000 | Freq: Every day | NASAL | 2 refills | Status: DC

## 2019-02-12 MED ORDER — ENSURE ENLIVE PO LIQD: 237.0000 mL | Freq: Two times a day (BID) | ORAL | 12 refills | Status: AC

## 2019-02-12 MED ORDER — METOPROLOL SUCCINATE ER 25 MG PO TB24: 25.0000 mg | ORAL_TABLET | Freq: Every day | ORAL | 4 refills | Status: AC

## 2019-02-12 MED ORDER — CLOPIDOGREL BISULFATE 75 MG PO TABS: 75.0000 mg | ORAL_TABLET | Freq: Every day | ORAL | 4 refills | Status: AC

## 2019-02-12 MED ORDER — FUROSEMIDE 20 MG PO TABS: 20.0000 mg | ORAL_TABLET | ORAL | 1 refills | Status: DC

## 2019-02-12 MED ORDER — CYCLOBENZAPRINE HCL 5 MG PO TABS: 10.0000 mg | ORAL_TABLET | Freq: Every day | ORAL | 1 refills | Status: DC

## 2019-02-12 MED ORDER — ONDANSETRON 4 MG PO TBDP: 4.0000 mg | ORAL_TABLET | Freq: Three times a day (TID) | ORAL | 0 refills | Status: AC | PRN

## 2019-02-12 MED ORDER — ATORVASTATIN CALCIUM 80 MG PO TABS: 80.0000 mg | ORAL_TABLET | Freq: Every evening | ORAL | 3 refills | Status: AC

## 2019-02-12 NOTE — Discharge Summary (Signed)
SETAREH ROM, is a 85 y.o. female  DOB [birth year]  MRN [id number].  Admission date:  02/08/2019  Admitting Physician  Kirstie Peri, MD  Discharge Date:  02/12/2019   Primary MD  Elias Else, MD  Recommendations for primary care physician for things to follow:   1)Avoid ibuprofen/Advil/Aleve/Motrin/Goody Powders/Naproxen/BC powders/Meloxicam/Diclofenac/Indomethacin and other Nonsteroidal anti-inflammatory medications as these will make you more likely to bleed and can cause stomach ulcers, can also cause Kidney problems.   2) take Plavix 75 mg daily for secondary stroke prevention  3)Neurologist Dr Gerilyn Pilgrim in 4 weeks Sidney Ace) for follow-up on stroke  4) use knee-high TED stockings----  on every morning off nightly  Admission Diagnosis  Dizziness [R42] Elevated troponin [R77.8]   Discharge Diagnosis  Dizziness [R42] Elevated troponin [R77.8]    Principal Problem:   Stroke-like symptoms Active Problems:   GERD   Lower extremity edema   Hyperlipidemia   NSTEMI (non-ST elevated myocardial infarction) (HCC)   Stroke-like symptom   Acute CVA (cerebrovascular accident) (HCC)   Cardiomyopathy (HCC)   Diarrhea      Past Medical History:  Diagnosis Date   Arthritis    Barrett esophagus    Brachiocephalic artery stenosis, right (HCC)    PER PT TOLD POSSIBLE PREVIOUS TIA DUE TO HX VISUAL DISTURBANCE   Common carotid artery stenosis    RIGHT   Complication of anesthesia    PONV   Cystitis    DDD (degenerative disc disease), lumbar    Diverticulosis    Fluid retention in legs    GERD (gastroesophageal reflux disease)    Glaucoma of both eyes    H/O hiatal hernia    Headache(784.0)    History of esophageal dilatation    2007   History of kidney stones    Left inguinal hernia    Nocturia    Occlusion of right vertebral artery without cerebral infarction     NEUROLOGIST-  DR PENUMALLI ([facility])   Urgency of urination    Wears glasses     Past Surgical History:  Procedure Laterality Date   APPENDECTOMY  1945   CATARACT EXTRACTION W/ INTRAOCULAR LENS  IMPLANT, BILATERAL  2001   CHOLECYSTECTOMY  1994   CYSTOSCOPY WITH BIOPSY N/A 02/24/2013   Procedure: CYSTOSCOPY FULGERATION AND BLADDER BIOPSY;  Surgeon: Martina Sinner, MD;  Location: [facility] [place];  Service: Urology;  Laterality: N/A;   DIRECT LARYNGOSCOPY  07-30-2008   W/ REMOVAL RIGHT VALLECULAR CYST   REMOVAL EAR CANAL CYST  1968   RIGHT OVARIAN CYSTECTOMY  1948   TEMPORAL ARTERY BIOPSY / LIGATION Left 01-03-2009   TONSILLECTOMY  1945   TUBAL LIGATION         HPI  from the history and physical done on the day of admission:    - Chief Complaint: Left leg weakness and dizziness   HPI:  85 year old woman ADL/IADL independent with past medical history is reviewed in the EMR was brought to the ER with complaint of generalized weakness and  dizziness.  As per patient she has not been feeling well throughout on 02/08/2019.  She states that yesterday evening around 6 PM she was walking with her walker when her legs gave way and she leaned against a TV cabinet and lowered herself down to the ground without hitting her head.  She had no loss of consciousness no bladder or bowel incontinence.  No blackout, any preceding dizziness or headache.  At the time of my interview patient stated that she has been having these symptoms for past 8 to 10 days which she describes as a feeling of unwell'' something passing through her body''.  She had no complaint of fever, chill, cough, congestion, nausea, vomiting, diarrhea, chest pain, shortness of breath, palpitation, headache and blurring of vision.  No tingling or numbness sensation at the time of interview.  As she complained of acute weakness in her left leg and was symptomatic for strokelike features code stroke was  called.  Telemetry neurologist recommended against TPA as her symptoms were more than 24 hours duration.  She was evaluated with CT Angie of head and neck which was impressive of No intracranial arterial occlusion or high-grade stenosis.2. Occlusion of the right vertebral artery V3 segment, which was occluded on the MRA of 10/02/2012.  Lab work was significant for markedly elevated high sensitive troponins [phone], cardiology was consulted the recommendation was to heparinize the patient but neurologist recommended to wait for the MRI before heparinizing the patient.  Patient admitted for MRI  To r/o acute stroke and cardiac eval in the morning.     Hospital Course:   -Brief Summary:- 85 year old woman ADL/IADL independentadmitted from independent living apartment admitted on 02/09/2019 with strokelike symptoms and elevated troponin-  -MRI brain consistent with acute stroke - transfer to SNF rehab   A/p 1)1.4 x 0.8 cm acute right pontine infarct.- MRI brain confirms acute pontine stroke,CT head and CTA neck without LVO -Echocardiogram with EF of 35 to 40%, with severe hypokinesis/akinesis of the distal anterior, mid/distal anterolateral, distal inferior and apical walls -  neurology consult from Dr Gerilyn Pilgrim appreciated -Speech and physical therapy eval appreciated -LDL 74 with HDL of 54,,A1c is 5.5 and TSH is 2.0 -PTA patient was on pravastatin 40 mg daily, changed to Lipitor 80 mgdaily Even if his lipid panel is within desired limits, patient should still take Lipitor/Statin for it's Pleiotropic effects (beyond cholesterol lowering benefits) -PTA patient was not on antiplatelet agents (intolerant of aspirin)  -c/n  Plavix 75 mg daily -Generalized weakness persist, subtle left-sided weakness persist, -Physical therapist recommends SNF rehab  2)NSTEMI- elevated troponin in the setting of acute stroke--- ?? NSTEMI-  echocardiogram with reduced EF (35 to 40 %) and significant wall  motion abnormalities as above #1-Troponin 2547 previous level 2559 -Continue Toprol-XL  25 mg daily , c/n Lipitor as above, Plavix as above -Chest pain-free -Cardiology consult appreciated  3)Nausea /vomiting and diarrhea---multiple episodes of loose stools,  stool for C. difficile is negative and stool for GI pathogen is Neg . Zofran PRN nausea vomiting -Hold Lasix due to concerns about fluid losses and risk for dehydration -Diarrhea and nausea has resolved -  4)HFrEF/ischemic cardiomyopathy--- EF 35 to 40% as noted in #1 above  -use TEDs for LE edema -Toprol-XL as above #2 -Restart Lasix 20 mg on Monday Wednesdays and Fridays  5)Social/Ethics--DNR/DNI, plan of care and goals of care discussed with patient and POA/great-granddaughter Tiffany at bedside  Disposition/- transfer to SNF rehab*  Code Status : DNR  Family  Communication:    (patient is alert, awake and coherent) -POA/great-granddaughter Tiffany at bedside  Disposition Plan  : SNF  Consults  : Neurology/cardiology   Discharge Condition: stable  Follow UP   Contact information for follow-up providers    Ellsworth LennoxStrader, Brittany M, PA-C Follow up on 03/03/2019.   Specialties: Physician Assistant, Cardiology Why: Hospital Follow-Up on 03/03/2019 at 2:30 PM.  Contact information: [address] [place] [id number] [phone]            Contact information for after-discharge care    Destination    [facility] Preferred SNF .   Service: Skilled Nursing Contact information: [address] [place] [id number] [phone]                  Diet and Activity recommendation:  As advised  Discharge Instructions    Discharge Instructions    Call MD for:  persistant dizziness or light-headedness   Complete by: As directed    Call MD for:  persistant nausea and vomiting   Complete by: As directed    Call MD for:  severe uncontrolled pain    Complete by: As directed    Call MD for:  temperature >100.4   Complete by: As directed    Diet - low sodium heart healthy   Complete by: As directed    Discharge instructions   Complete by: As directed    1)Avoid ibuprofen/Advil/Aleve/Motrin/Goody Powders/Naproxen/BC powders/Meloxicam/Diclofenac/Indomethacin and other Nonsteroidal anti-inflammatory medications as these will make you more likely to bleed and can cause stomach ulcers, can also cause Kidney problems.   2) take Plavix 75 mg daily for secondary stroke prevention  3)Neurologist Dr Gerilyn Pilgr[place]ah in 4 weeks [place] Ace([place]) for follow-up on stroke  4) use knee-high TED stockings----  on every morning off nightly   Increase activity slowly   Complete by: As directed         Discharge Medications     Allergies as of 02/12/2019      Reactions   Aspirin Hives   Penicillins Hives   .Did it involve swelling of the face/tongue/throat, SOB, or low BP? No Did it involve sudden or severe rash/hives, skin peeling, or any reaction on the inside of your mouth or nose? Yes Did you need to seek medical attention at a hospital or doctor's office? Yes When did it last happen?Over ten years ago If all above answers are NO, may proceed with cephalosporin use.   Ciprofloxacin Rash      Medication List    STOP taking these medications   pravastatin 40 MG tablet Commonly known as: PRAVACHOL   trimethoprim 100 MG tablet Commonly known as: TRIMPEX     TAKE these medications   acetaminophen 325 MG tablet Commonly known as: TYLENOL Take 650 mg by mouth every 6 (six) hours as needed for pain.   alendronate 70 MG tablet Commonly known as: FOSAMAX Take 70 mg by mouth every 7 (seven) days. Take with a full glass of water on an empty stomach.   atorvastatin 80 MG tablet Commonly known as: LIPITOR Take 1 tablet (80 mg total) by mouth every evening.   bimatoprost 0.01 % Soln Commonly known as: LUMIGAN Place 1 drop into both eyes  at bedtime.   Calcium 600/Vitamin D3 600-800 MG-UNIT Tabs Generic drug: Calcium Carb-Cholecalciferol Take 1 tablet by mouth daily.   clopidogrel 75 MG tablet Commonly known as: PLAVIX Take 1 tablet (75 mg total) by mouth daily.  Start taking on: February 13, 2019   cyclobenzaprine 5 MG tablet Commonly known as: FLEXERIL Take 2 tablets (10 mg total) by mouth at bedtime. What changed:   medication strength  when to take this  reasons to take this   dorzolamide-timolol 22.3-6.8 MG/ML ophthalmic solution Commonly known as: COSOPT Place 1 drop into both eyes 2 (two) times daily.   feeding supplement (ENSURE ENLIVE) Liqd Take 237 mLs by mouth 2 (two) times daily between meals. Start taking on: February 13, 2019   fluticasone 50 MCG/ACT nasal spray Commonly known as: FLONASE Place 1 spray into both nostrils daily. What changed:   how much to take  how to take this  when to take this  reasons to take this   furosemide 20 MG tablet Commonly known as: Lasix Take 1 tablet (20 mg total) by mouth every Monday, Wednesday, and Friday. Start taking on: February 13, 2019 What changed:   medication strength  how much to take  when to take this  reasons to take this  additional instructions   metoprolol succinate 25 MG 24 hr tablet Commonly known as: TOPROL-XL Take 1 tablet (25 mg total) by mouth daily.   omeprazole 20 MG capsule Commonly known as: PRILOSEC Take 20 mg by mouth daily.   ondansetron 4 MG disintegrating tablet Commonly known as: Zofran ODT Take 1 tablet (4 mg total) by mouth every 8 (eight) hours as needed for nausea or vomiting.   potassium chloride 10 MEQ tablet Commonly known as: KLOR-CON Take 20 mEq by mouth 2 (two) times daily.   triamcinolone cream 0.1 % Commonly known as: KENALOG Apply 1 application topically as needed.   trolamine salicylate 10 % cream Commonly known as: ASPERCREME Apply 1 application topically as needed.       Major  procedures and Radiology Reports - PLEASE review detailed and final reports for all details, in brief -   Ct Angio Head W Or Wo Contrast  Result Date: 02/09/2019 CLINICAL DATA:  dizziness and weakness EXAM: CT ANGIOGRAPHY HEAD AND NECK TECHNIQUE: Multidetector CT imaging of the head and neck was performed using the standard protocol during bolus administration of intravenous contrast. Multiplanar CT image reconstructions and MIPs were obtained to evaluate the vascular anatomy. Carotid stenosis measurements (when applicable) are obtained utilizing NASCET criteria, using the distal internal carotid diameter as the denominator. CONTRAST:  OMNIPAQUE IOHEXOL 350 MG/ML SOLN COMPARISON:  MRA 10/02/2012. FINDINGS: CTA NECK FINDINGS SKELETON: There is no bony spinal canal stenosis. No lytic or blastic lesion. OTHER NECK: Normal pharynx, larynx and major salivary glands. No cervical lymphadenopathy. Unremarkable thyroid gland. UPPER CHEST: No pneumothorax or pleural effusion. No nodules or masses. AORTIC ARCH: There is mild calcific atherosclerosis of the aortic arch. There is no aneurysm, dissection or hemodynamically significant stenosis of the visualized portion of the aorta. Conventional 3 vessel aortic branching pattern. The visualized proximal subclavian arteries are widely patent. RIGHT CAROTID SYSTEM: No dissection, occlusion or aneurysm. Mild atherosclerotic calcification at the carotid bifurcation without hemodynamically significant stenosis. LEFT CAROTID SYSTEM: No dissection, occlusion or aneurysm. Mild atherosclerotic calcification at the carotid bifurcation without hemodynamically significant stenosis. VERTEBRAL ARTERIES: Left dominant configuration. Both origins are clearly patent. The right vertebral artery V3 segment is occluded, as it was on 10/02/2012. The left vertebral artery is normal. CTA HEAD FINDINGS POSTERIOR CIRCULATION: --Vertebral arteries: Both V4 segments are opacified. --Posterior  inferior cerebellar arteries (PICA): Patent origins from the vertebral arteries. --Anterior inferior cerebellar arteries (AICA): Patent origins from  the basilar artery. --Basilar artery: Normal. --Superior cerebellar arteries: Normal. --Posterior cerebral arteries: Normal. Both originate from the basilar artery. Posterior communicating arteries (p-comm) are diminutive or absent. ANTERIOR CIRCULATION: --Intracranial internal carotid arteries: Atherosclerotic calcification of the internal carotid arteries at the skull base without hemodynamically significant stenosis. --Anterior cerebral arteries (ACA): Normal. Both A1 segments are present. Patent anterior communicating artery (a-comm). --Middle cerebral arteries (MCA): Normal. VENOUS SINUSES: As permitted by contrast timing, patent. ANATOMIC VARIANTS: None Review of the MIP images confirms the above findings. IMPRESSION: 1. No intracranial arterial occlusion or high-grade stenosis. 2. Occlusion of the right vertebral artery V3 segment, which was occluded on the MRA of 10/02/2012 3. Aortic Atherosclerosis (ICD10-I70.0). Electronically Signed   By: Deatra Robinson M.D.   On: 02/09/2019 02:50   Dg Chest 2 View  Result Date: 02/09/2019 CLINICAL DATA:  Weakness EXAM: CHEST - 2 VIEW COMPARISON:  05/27/2008 FINDINGS: No pleural effusion. Diffuse coarse interstitial opacity with mild reticulation suggesting component of chronic interstitial lung disease. Streaky opacities in the upper lobes and left base. Normal cardiomediastinal silhouette. No pneumothorax. IMPRESSION: 1. Diffuse coarse and mildly reticular interstitial opacity, suspect for component of chronic interstitial lung disease. Streaky opacities are present within the right greater than left upper lobe and the left lung base and may reflect acute superimposed pneumonia. Electronically Signed   By: Jasmine Pang M.D.   On: 02/09/2019 01:17   Ct Head Wo Contrast  Result Date: 02/09/2019 CLINICAL DATA:   Vertigo, episodic EXAM: CT HEAD WITHOUT CONTRAST TECHNIQUE: Contiguous axial images were obtained from the base of the skull through the vertex without intravenous contrast. COMPARISON:  None. FINDINGS: Brain: No evidence of acute territorial infarction, hemorrhage, hydrocephalus,extra-axial collection or mass lesion/mass effect. There is dilatation the ventricles and sulci consistent with age-related atrophy. Low-attenuation changes in the deep white matter consistent with small vessel ischemia. Vascular: No hyperdense vessel or unexpected calcification. Skull: The skull is intact. No fracture or focal lesion identified. Sinuses/Orbits: The visualized paranasal sinuses and mastoid air cells are clear. The orbits and globes intact. Other: None IMPRESSION: No acute intracranial abnormality. Findings consistent with age related atrophy and chronic small vessel ischemia Electronically Signed   By: Jonna Clark M.D.   On: 02/09/2019 01:03   Ct Angio Neck W And/or Wo Contrast  Result Date: 02/09/2019 CLINICAL DATA:  dizziness and weakness EXAM: CT ANGIOGRAPHY HEAD AND NECK TECHNIQUE: Multidetector CT imaging of the head and neck was performed using the standard protocol during bolus administration of intravenous contrast. Multiplanar CT image reconstructions and MIPs were obtained to evaluate the vascular anatomy. Carotid stenosis measurements (when applicable) are obtained utilizing NASCET criteria, using the distal internal carotid diameter as the denominator. CONTRAST:  OMNIPAQUE IOHEXOL 350 MG/ML SOLN COMPARISON:  MRA 10/02/2012. FINDINGS: CTA NECK FINDINGS SKELETON: There is no bony spinal canal stenosis. No lytic or blastic lesion. OTHER NECK: Normal pharynx, larynx and major salivary glands. No cervical lymphadenopathy. Unremarkable thyroid gland. UPPER CHEST: No pneumothorax or pleural effusion. No nodules or masses. AORTIC ARCH: There is mild calcific atherosclerosis of the aortic arch. There is no  aneurysm, dissection or hemodynamically significant stenosis of the visualized portion of the aorta. Conventional 3 vessel aortic branching pattern. The visualized proximal subclavian arteries are widely patent. RIGHT CAROTID SYSTEM: No dissection, occlusion or aneurysm. Mild atherosclerotic calcification at the carotid bifurcation without hemodynamically significant stenosis. LEFT CAROTID SYSTEM: No dissection, occlusion or aneurysm. Mild atherosclerotic calcification at the carotid bifurcation without hemodynamically significant stenosis.  VERTEBRAL ARTERIES: Left dominant configuration. Both origins are clearly patent. The right vertebral artery V3 segment is occluded, as it was on 10/02/2012. The left vertebral artery is normal. CTA HEAD FINDINGS POSTERIOR CIRCULATION: --Vertebral arteries: Both V4 segments are opacified. --Posterior inferior cerebellar arteries (PICA): Patent origins from the vertebral arteries. --Anterior inferior cerebellar arteries (AICA): Patent origins from the basilar artery. --Basilar artery: Normal. --Superior cerebellar arteries: Normal. --Posterior cerebral arteries: Normal. Both originate from the basilar artery. Posterior communicating arteries (p-comm) are diminutive or absent. ANTERIOR CIRCULATION: --Intracranial internal carotid arteries: Atherosclerotic calcification of the internal carotid arteries at the skull base without hemodynamically significant stenosis. --Anterior cerebral arteries (ACA): Normal. Both A1 segments are present. Patent anterior communicating artery (a-comm). --Middle cerebral arteries (MCA): Normal. VENOUS SINUSES: As permitted by contrast timing, patent. ANATOMIC VARIANTS: None Review of the MIP images confirms the above findings. IMPRESSION: 1. No intracranial arterial occlusion or high-grade stenosis. 2. Occlusion of the right vertebral artery V3 segment, which was occluded on the MRA of 10/02/2012 3. Aortic Atherosclerosis (ICD10-I70.0). Electronically  Signed   By: Ulyses Jarred M.D.   On: 02/09/2019 02:50   Mr Jeri Cos XH Contrast  Result Date: 02/09/2019 CLINICAL DATA:  Ataxia, stroke suspected. Additional history: Vertigo x 2 days EXAM: MRI HEAD WITHOUT AND WITH CONTRAST TECHNIQUE: Multiplanar, multiecho pulse sequences of the brain and surrounding structures were obtained without and with intravenous contrast. CONTRAST:  66mL GADAVIST GADOBUTROL 1 MMOL/ML IV SOLN COMPARISON:  Non-contrast CT head and CT angiogram head/neck 02/09/2019, brain MRI 10/02/2012 (images unavailable), FINDINGS: Brain: Multiple sequences are motion degraded, limiting evaluation There is a 1.4 x 0.8 cm focus of restricted diffusion within the right pons consistent with acute infarction. Corresponding T2/FLAIR hyperintensity at this site. Advanced chronic small vessel ischemic disease with small bilateral chronic basal ganglia and thalamic lacunar infarcts, progressed from prior MRI 10/02/2012. No evidence of intracranial mass. No midline shift or extra-axial fluid collection. No chronic intracranial blood products. Mild generalized parenchymal atrophy. No abnormal intracranial enhancement is identified. Vascular: Flow voids maintained within the proximal large arterial vessels. Skull and upper cervical spine: No focal marrow lesion Sinuses/Orbits: Visualized orbits demonstrate no acute abnormality. Mild ethmoid sinus mucosal thickening. Visualized mastoid air cells are clear. These results were called by telephone at the time of interpretation on 02/09/2019 at 1:19 pm to provider [facility] , who verbally acknowledged these results. IMPRESSION: 1. Motion degraded examination. 2. 1.4 x 0.8 cm acute right pontine infarct. 3. Advanced chronic small vessel ischemic disease with bilateral chronic basal ganglia/thalamic lacunar infarcts, progressed from prior MRI 10/02/2012. 4. Mild generalized parenchymal atrophy. Electronically Signed   By: Kellie Simmering   On: 02/09/2019 13:20     Micro Results   Recent Results (from the past 240 hour(s))  Blood culture (routine x 2)     Status: None (Preliminary result)   Collection Time: 02/09/19 12:23 AM   Specimen: BLOOD LEFT FOREARM  Result Value Ref Range Status   Specimen Description BLOOD LEFT FOREARM  Final   Special Requests   Final    BOTTLES DRAWN AEROBIC AND ANAEROBIC Blood Culture adequate volume   Culture   Final    NO GROWTH 3 DAYS Performed at [facility], [address] [place], [place] [address]    Report Status PENDING  Incomplete  Blood culture (routine x 2)     Status: None (Preliminary result)   Collection Time: 02/09/19 12:35 AM   Specimen: BLOOD RIGHT HAND  Result Value  Ref Range Status   Specimen Description BLOOD RIGHT HAND  Final   Special Requests   Final    BOTTLES DRAWN AEROBIC AND ANAEROBIC Blood Culture adequate volume   Culture   Final    NO GROWTH 3 DAYS Performed at [facility], [address], [place], [place] [address]    Report Status PENDING  Incomplete  SARS CORONAVIRUS 2 (TAT 6-24 HRS) Nasopharyngeal Nasopharyngeal Swab     Status: None   Collection Time: 02/09/19  4:19 AM   Specimen: Nasopharyngeal Swab  Result Value Ref Range Status   SARS Coronavirus 2 NEGATIVE NEGATIVE Final    Comment: (NOTE) SARS-CoV-2 target nucleic acids are NOT DETECTED. The SARS-CoV-2 RNA is generally detectable in upper and lower respiratory specimens during the acute phase of infection. Negative results do not preclude SARS-CoV-2 infection, do not rule out co-infections with other pathogens, and should not be used as the sole basis for treatment or other patient management decisions. Negative results must be combined with clinical observations, patient history, and epidemiological information. The expected result is Negative. Fact Sheet for Patients: HairSlick.no Fact Sheet for Healthcare Providers: quierodirigir.com This test is not  yet approved or cleared by the [place] FDA and  has been authorized for detection and/or diagnosis of SARS-CoV-2 by FDA under an Emergency Use Authorization (EUA). This EUA will remain  in effect (meaning this test can be used) for the duration of the COVID-19 declaration under Section 56 4(b)(1) of the Act, 21 U.S.C. section 360bbb-3(b)(1), unless the authorization is terminated or revoked sooner. Performed at [facility], [address] [place], [place] [address]   MRSA PCR Screening     Status: None   Collection Time: 02/09/19  5:07 AM   Specimen: Nasal Mucosa; Nasopharyngeal  Result Value Ref Range Status   MRSA by PCR NEGATIVE NEGATIVE Final    Comment:        The GeneXpert MRSA Assay (FDA approved for NASAL specimens only), is one component of a comprehensive MRSA colonization surveillance program. It is not intended to diagnose MRSA infection nor to guide or monitor treatment for MRSA infections. Performed at [facility], [address] [place], [place] [address]   C difficile quick scan w PCR reflex     Status: None   Collection Time: 02/09/19  4:28 PM   Specimen: STOOL  Result Value Ref Range Status   C Diff antigen NEGATIVE NEGATIVE Final   C Diff toxin NEGATIVE NEGATIVE Final   C Diff interpretation No C. difficile detected.  Final    Comment: Performed at [facility], [address] [place], [place] [address]  GI pathogen panel by PCR, stool     Status: None   Collection Time: 02/09/19  4:29 PM   Specimen: STOOL  Result Value Ref Range Status   Plesiomonas shigelloides NOT DETECTED NOT DETECTED Final   Yersinia enterocolitica NOT DETECTED NOT DETECTED Final   Vibrio NOT DETECTED NOT DETECTED Final   Enteropathogenic E coli NOT DETECTED NOT DETECTED Final   E coli (ETEC) LT/ST NOT DETECTED NOT DETECTED Final   E coli 0157 by PCR Not applicable NOT DETECTED Final   Cryptosporidium by PCR NOT DETECTED NOT DETECTED Final   Entamoeba histolytica  NOT DETECTED NOT DETECTED Final   Adenovirus F 40/41 NOT DETECTED NOT DETECTED Final   Norovirus GI/GII NOT DETECTED NOT DETECTED Final   Sapovirus NOT DETECTED NOT DETECTED Final    Comment: (NOTE) Performed At: [facility]  [address] [place], [place] [id number] Jolene Schimke MD WG:[id number]    Vibrio cholerae NOT DETECTED NOT DETECTED Final   Campylobacter by PCR NOT DETECTED NOT DETECTED Final   Salmonella by PCR NOT DETECTED NOT DETECTED Final   E coli (STEC) NOT DETECTED NOT DETECTED Final   Enteroaggregative E coli NOT DETECTED NOT DETECTED Final   Shigella by PCR NOT DETECTED NOT DETECTED Final   Cyclospora cayetanensis NOT DETECTED NOT DETECTED Final   Astrovirus NOT DETECTED NOT DETECTED Final   G lamblia by PCR NOT DETECTED NOT DETECTED Final   Rotavirus A by PCR NOT DETECTED NOT DETECTED Final       Today   Subjective    Hennie Balch today has no new complaints -No fevers, no chest pain, no shortness of breath          Patient has been seen and examined prior to discharge   Objective   Blood pressure (!) 119/55, pulse 75, temperature 98.3 F (36.8 C), temperature source Oral, resp. rate 18, height  (1.6 m), weight 80.2 kg, SpO2 98 %.   Intake/Output Summary (Last 24 hours) at 02/12/2019 1413 Last data filed at 02/12/2019 1300 Gross per 24 hour  Intake 480 ml  Output 550 ml  Net -70 ml    Exam Gen:- Awake Alert,  In no apparent distress  HEENT:- [place].AT, No sclera icterus Eyes-diplopia and visual symptoms improving Neck-Supple Neck,No JVD,.  Lungs-  CTAB , fair symmetrical air movement CV- S1, S2 normal, regular  Abd-  +ve B.Sounds, Abd Soft, No tenderness,    Extremity/Skin:- 1+ve  edema, pedal pulses present  Psych-affect is appropriate, oriented x3 Neuro-generalized weakness, facial asymmetry, subtle left-sided weakness, unsteady gait/ataxia, no tremors   Data Review   CBC w Diff:  Lab Results  Component Value Date   WBC  10.6 (H) 02/10/2019   HGB 14.7 02/10/2019   HCT 47.6 (H) 02/10/2019   PLT 196 02/10/2019   LYMPHOPCT 22 02/08/2019   MONOPCT 10 02/08/2019   EOSPCT 2 02/08/2019   BASOPCT 0 02/08/2019    CMP:  Lab Results  Component Value Date   NA 138 02/10/2019   K 3.6 02/10/2019   CL 106 02/10/2019   CO2 23 02/10/2019   BUN 20 02/10/2019   CREATININE 0.99 02/10/2019   PROT 6.5 02/10/2019   ALBUMIN 3.3 (L) 02/10/2019   BILITOT 0.5 02/10/2019   ALKPHOS 42 02/10/2019   AST 29 02/10/2019   ALT 13 02/10/2019    Total Discharge time is about 33 minutes  Shon Hale M.D on 02/12/2019 at 2:13 PM  Go to [email or web address] -  for contact info  [facility] - Office  [phone]

## 2019-02-12 NOTE — TOC Transition Note (Signed)
Transition of Care [facility]) - CM/SW Discharge Note   Patient Details  Name: KASIYA BURCK MRN: [id number] Date of Birth: 09-May-1922  Transition of Care [facility]) CM/SW Contact:  Ihor Gully, LCSW Phone Number: 02/12/2019, 2:56 PM   Clinical Narrative:    Discharge clinicals sent to facility. [facility] transport arranged.  TOC signing off.      Barriers to Discharge: Continued Medical Work up, SNF Pending bed offer   Patient Goals and CMS Choice Patient states their goals for this hospitalization and ongoing recovery are:: go to rehab then home; CMS Medicare.gov Compare Post Acute Care list provided to:: Patient Choice offered to / list presented to : Patient  Discharge Placement              Patient chooses bed at: [facility] Patient to be transferred to facility by: Starr Name of family member notified: Tiffany Patient and family notified of of transfer: 02/12/19  Discharge Plan and Services   Discharge Planning Services: CM Consult Post Acute Care Choice: Nursing Home                               Social Determinants of Health (SDOH) Interventions     Readmission Risk Interventions No flowsheet data found.

## 2019-02-12 NOTE — Progress Notes (Signed)
IV and flexiseal tube removed from patient. Tolerated well.

## 2019-02-12 NOTE — Discharge Instructions (Signed)
1)Avoid ibuprofen/Advil/Aleve/Motrin/Goody Powders/Naproxen/BC powders/Meloxicam/Diclofenac/Indomethacin and other Nonsteroidal anti-inflammatory medications as these will make you more likely to bleed and can cause stomach ulcers, can also cause Kidney problems.   2) take Plavix 75 mg daily for secondary stroke prevention  3)Neurologist Dr Merlene Laughter in 4 weeks Linna Hoff) for follow-up on stroke  4) use knee-high TED stockings----  on every morning off nightly

## 2019-02-14 LAB — CULTURE, BLOOD (ROUTINE X 2)
Culture: NO GROWTH
Culture: NO GROWTH
Special Requests: ADEQUATE
Special Requests: ADEQUATE

## 2019-03-03 ENCOUNTER — Telehealth (INDEPENDENT_AMBULATORY_CARE_PROVIDER_SITE_OTHER): Payer: Medicare Other | Admitting: Student

## 2019-03-03 ENCOUNTER — Encounter: Payer: Self-pay | Admitting: Student

## 2019-03-03 VITALS — BP 138/72 | HR 60 | Wt 173.0 lb

## 2019-03-03 DIAGNOSIS — E785 Hyperlipidemia, unspecified: Secondary | ICD-10-CM

## 2019-03-03 DIAGNOSIS — I5042 Chronic combined systolic (congestive) and diastolic (congestive) heart failure: Secondary | ICD-10-CM

## 2019-03-03 DIAGNOSIS — Z8673 Personal history of transient ischemic attack (TIA), and cerebral infarction without residual deficits: Secondary | ICD-10-CM

## 2019-03-03 DIAGNOSIS — Z79899 Other long term (current) drug therapy: Secondary | ICD-10-CM

## 2019-03-03 DIAGNOSIS — I214 Non-ST elevation (NSTEMI) myocardial infarction: Secondary | ICD-10-CM

## 2019-03-03 MED ORDER — FUROSEMIDE 20 MG PO TABS: 20.0000 mg | ORAL_TABLET | Freq: Every day | ORAL | 3 refills | Status: AC

## 2019-03-03 NOTE — Patient Instructions (Signed)
Medication Instructions:  Your physician has recommended you make the following change in your medication:  Increase Lasix to 20 mg Daily    Labwork: Your physician recommends that you return for lab work in: 2 Weeks ( BMET)    Testing/Procedures: NONE   Follow-Up: Your physician recommends that you schedule a follow-up appointment in: 3-4 Weeks    Any Other Special Instructions Will Be Listed Below (If Applicable).     If you need a refill on your cardiac medications before your next appointment, please call your pharmacy.  Thank you for choosing [facility]!

## 2019-03-03 NOTE — Progress Notes (Signed)
Virtual Visit via Telephone Note   This visit type was conducted due to national recommendations for restrictions regarding the COVID-19 Pandemic (e.g. social distancing) in an effort to limit this patient's exposure and mitigate transmission in our community.  Due to her co-morbid illnesses, this patient is at least at moderate risk for complications without adequate follow up.  This format is felt to be most appropriate for this patient at this time.  The patient did not have access to video technology/had technical difficulties with video requiring transitioning to audio format only (telephone).  All issues noted in this document were discussed and addressed.  No physical exam could be performed with this format.  Please refer to the patient's chart for her  consent to telehealth [facility].   Date:  03/03/2019   ID:  Linda CowdenIva L Donaldson, DOB 11/15/1922, MRN [id number]  Patient Location: Skilled Nursing Facility Provider Location: Office  PCP:  Elias Elseeade, Robert, MD  Cardiologist:  Dietrich PatesPaula Ross, MD  Electrophysiologist:  None   Evaluation Performed:  Follow-Up Visit  Chief Complaint: Hospital Follow-up  History of Present Illness:    Linda Donaldson is a 85 y.o. female with past medical history of carotid artery stenosis, arthritis and HLD who presents for a hospital follow-up telehealth visit.   She presented to [facility] ED on 02/08/2019 for evaluation of worsening weakness and dizziness, found to have an NSTEMI with peak troponin elevation of 2559. Echocardiogram showed EF was reduced to 35-40% with severe HK/AK of the distal anterior, mid/distal anterolateral and distal inferior apical walls. LA was severely dilated and she had mild to moderate TR and mild aortic sclerosis without stenosis. Was also found to have an acute right pontine CVA which was thought to be a small vessel lacunar event. Was started on Plavix and statin therapy (allergic to ASA) for her CVA. Given her advanced  age and CVA, medical management of her NSTEMI and cardiomyopathy were recommended and she was started on Toprol-XL 12.5mg  daily. BP did not allow for the addition of ACE-I or ARB.   In talking with the patient today, she reports that her left-sided weakness persists but has started to improve some since hospital discharge. She has been working with physical therapy and occupational therapy multiple times per week [facility].  She denies any recent dyspnea on exertion, chest pain, palpitations, orthopnea or PND. She has been experiencing worsening lower extremity edema. I reviewed her weights with nursing staff today and this has overall been stable in the low 170's since discharge. According to the patient, she was taking Lasix 20 mg daily prior to admission but this was only ordered to take as MWF dosing following hospital discharge.   The patient does not have symptoms concerning for COVID-19 infection (fever, chills, cough, or new shortness of breath).    Past Medical History:  Diagnosis Date   Arthritis    Barrett esophagus    Brachiocephalic artery stenosis, right (HCC)    PER PT TOLD POSSIBLE PREVIOUS TIA DUE TO HX VISUAL DISTURBANCE   Common carotid artery stenosis    RIGHT   Complication of anesthesia    PONV   Cystitis    DDD (degenerative disc disease), lumbar    Diverticulosis    Fluid retention in legs    GERD (gastroesophageal reflux disease)    Glaucoma of both eyes    H/O hiatal hernia    Headache(784.0)    History of esophageal dilatation    2007  History of kidney stones    Left inguinal hernia    Nocturia    Occlusion of right vertebral artery without cerebral infarction    NEUROLOGIST-  DR PENUMALLI ([facility])   Urgency of urination    Wears glasses    Past Surgical History:  Procedure Laterality Date   APPENDECTOMY  1945   CATARACT EXTRACTION W/ INTRAOCULAR LENS  IMPLANT, BILATERAL  2001   CHOLECYSTECTOMY  1994   CYSTOSCOPY  WITH BIOPSY N/A 02/24/2013   Procedure: CYSTOSCOPY FULGERATION AND BLADDER BIOPSY;  Surgeon: Reece Packer, MD;  Location: [facility];  Service: Urology;  Laterality: N/A;   DIRECT LARYNGOSCOPY  07-30-2008   W/ REMOVAL RIGHT VALLECULAR CYST   REMOVAL EAR CANAL CYST  1968   RIGHT OVARIAN CYSTECTOMY  1948   TEMPORAL ARTERY BIOPSY / LIGATION Left 01-03-2009   TONSILLECTOMY  1945   TUBAL LIGATION       No outpatient medications have been marked as taking for the 03/03/19 encounter (Appointment) with Linda Heritage, PA-C.     Allergies:   Aspirin, Penicillins, and Ciprofloxacin   Social History   Tobacco Use   Smoking status: Former Smoker    Years: 15.00    Types: Cigarettes    Quit date: 02/20/1952    Years since quitting: 67.0   Smokeless tobacco: Never Used  Substance Use Topics   Alcohol use: No   Drug use: No     Family Hx: The patient's family history includes Cancer in her mother.  ROS:   Please see the history of present illness.     All other systems reviewed and are negative.   Prior CV studies:   The following studies were reviewed today:  Echocardiogram: 02/09/2019 IMPRESSIONS    1. Left ventricular ejection fraction, by visual estimation, is 35 to 40%. The left ventricle has moderately decreased function.  2. Difficult acoustic windows. LVEF is depressed at approximately 35 to 40% with severe hypokinesis/akinesis of the distal anterior, mid/distal anterolateral, distal inferior and apical walls.  3. Global right ventricle has normal systolic function.The right ventricular size is normal. No increase in right ventricular wall thickness.  4. Left atrial size was severely dilated.  5. Right atrial size was normal.  6. Mild mitral annular calcification.  7. The mitral valve is abnormal. Trace mitral valve regurgitation.  8. The tricuspid valve is normal in structure. Tricuspid valve regurgitation mild-moderate.  9. The  aortic valve is tricuspid Aortic valve regurgitation was not visualized by color flow Doppler. Mild aortic valve sclerosis without stenosis. 10. The pulmonic valve was not well visualized. Pulmonic valve regurgitation is trivial by color flow Doppler 11. Severely elevated pulmonary artery systolic [id number](>84 mm Hg).  Labs/Other Tests and Data Reviewed:    EKG:  No ECG reviewed.  Recent Labs: 02/08/2019: B Natriuretic Peptide 176.0 02/09/2019: TSH 2.043 02/10/2019: ALT 13; BUN 20; Creatinine, Ser 0.99; Hemoglobin 14.7; Platelets 196; Potassium 3.6; Sodium 138   Recent Lipid Panel Lab Results  Component Value Date/Time   CHOL 151 02/09/2019 06:59 AM   TRIG 116 02/09/2019 06:59 AM   HDL 54 02/09/2019 06:59 AM   CHOLHDL 2.8 02/09/2019 06:59 AM   LDLCALC 74 02/09/2019 06:59 AM    Wt Readings from Last 3 Encounters:  02/12/19 176 lb 12.9 oz (80.2 kg)  02/24/13 181 lb (82.1 kg)  09/11/12 187 lb (84.8 kg)     Objective:    Vital Signs:  There were no vitals taken for  this visit.   General: Pleasant female sounding in NAD Psych: Normal affect. Neuro: Alert and oriented X 3.  Lungs:  Resp regular and unlabored while talking on the phone.   ASSESSMENT & PLAN:    1. Chronic Combined Systolic and Diastolic CHF - Recent echocardiogram showed her EF was reduced at 35 to 40% as outlined above. She denies any specific dyspnea on exertion or orthopnea but has experienced worsening lower extremity edema. - By review of her medication list, it appears that Lasix was accidentally reduced to MWF dosing at the time of discharge. Will titrate to 20mg  daily. Will obtain a repeat BMET in 2 weeks. Recommended that daily weights continued to be obtained while at SNF.  - continue Toprol-XL 25mg  daily. Will not add ACE-I or ARB at this time given dose titration of Lasix and recent issues with low-normal BP (SBP has been in the low-100's at times when checked at SNF - at 138/72 today after working with  PT).   2. Subsequent Episode of Care for NSTEMI - She was recently found to have an NSTEMI with peak troponin values of 2559 in the setting of an acute CVA. Medical management was pursued in the setting of her comorbidities and advanced age. - She denies any recent chest pain or dyspnea on exertion. - Continue with medical therapy at this time. Remains on Plavix 75 mg daily given intolerance to ASA.  Also continue beta-blocker and statin therapy.  3. HLD - FLP during recent admission showed total cholesterol 151, triglycerides 116, HDL 54 and LDL 74.  She was started on Atorvastatin 80 mg daily per Neurology. Will need repeat FLP and LFT's in 6-8 weeks.   4. History of CVA - found to have an acute right pontine CVA during her recent admission. Continues to have left-sided weakness but is working with PT and OT at SNF. Remains on Plavix and statin therapy.   COVID-19 Education: The signs and symptoms of COVID-19 were discussed with the patient and how to seek care for testing (follow up with PCP or arrange E-visit).  The importance of social distancing was discussed today.  Time:   Today, I have spent 14 minutes with the patient with telehealth technology discussing the above problems.     Medication Adjustments/Labs and Tests Ordered: Current medicines are reviewed at length with the patient today.  Concerns regarding medicines are outlined above.   Tests Ordered: No orders of the defined types were placed in this encounter.   Medication Changes: No orders of the defined types were placed in this encounter.   Follow Up:  Virtual Visit  in 3-4 weeks.   Signed, , PA-C  03/03/2019 6:40 AM    [facility]

## 2019-03-27 ENCOUNTER — Encounter: Payer: Self-pay | Admitting: Student

## 2019-03-27 ENCOUNTER — Telehealth (INDEPENDENT_AMBULATORY_CARE_PROVIDER_SITE_OTHER): Payer: Medicare Other | Admitting: Student

## 2019-03-27 VITALS — BP 124/80 | HR 76 | Temp 97.8°F | Resp 16 | Wt 161.0 lb

## 2019-03-27 DIAGNOSIS — Z8673 Personal history of transient ischemic attack (TIA), and cerebral infarction without residual deficits: Secondary | ICD-10-CM

## 2019-03-27 DIAGNOSIS — I5042 Chronic combined systolic (congestive) and diastolic (congestive) heart failure: Secondary | ICD-10-CM

## 2019-03-27 DIAGNOSIS — I2510 Atherosclerotic heart disease of native coronary artery without angina pectoris: Secondary | ICD-10-CM

## 2019-03-27 DIAGNOSIS — E785 Hyperlipidemia, unspecified: Secondary | ICD-10-CM

## 2019-03-27 MED ORDER — FUROSEMIDE 20 MG PO TABS: 20.0000 mg | ORAL_TABLET | Freq: Every day | ORAL | 3 refills | Status: AC

## 2019-03-27 NOTE — Progress Notes (Addendum)
Virtual Visit via Telephone Note   This visit type was conducted due to national recommendations for restrictions regarding the COVID-19 Pandemic (e.g. social distancing) in an effort to limit this patient's exposure and mitigate transmission in our community.  Due to her co-morbid illnesses, this patient is at least at moderate risk for complications without adequate follow up.  This format is felt to be most appropriate for this patient at this time.  The patient did not have access to video technology/had technical difficulties with video requiring transitioning to audio format only (telephone).  All issues noted in this document were discussed and addressed.  No physical exam could be performed with this format.  Please refer to the patient's chart for her  consent to telehealth for [facility].   Date:  03/27/2019   ID:  Linda Donaldson, DOB 1922-11-24, MRN [id number]  Patient Location: Skilled Nursing Facility Provider Location: Office  PCP:  Elias Else, MD  Cardiologist:  Dietrich Pates, MD  Electrophysiologist:  None   Evaluation Performed:  Follow-Up Visit  Chief Complaint:  4 week visit  History of Present Illness:    Linda Donaldson is a 85 y.o. female with past medical history of presumed CAD (NSTEMI in 02/2019 with medical management recommended due to her advanced age and recent CVA), chronic combined systolic and diastolic CHF (EF 51-88% by echo in 02/2019), acute right pontine CVA (occurring in 02/2019), HTN and HLD who presents for a 4-week telehealth follow-up visit.   She most recently had a telehealth visit with myself on 03/03/2019 following her recent admission for CVA and NSTEMI. She had been discharged to SNF and was working with PT and OT multiple times per week to help with her left-sided weakness. She had started to experience worsening lower extremity edema and Lasix had been reduced to MWF dosing at the time of hospital discharge. Therefore, this was  restarted at her prior dosing of 20mg  daily with plans for a repeat BMET in 2 weeks. Was continued on Toprol-XL 25mg  daily with consideration of ARB at the time of follow-up if BP allowed and renal function remained stable. Was also on Plavix 75mg  daily (intolerant to ASA) and Atorvastatin 80mg  daily.   In talking with the patient and her nurse today, she reports breathing has been at baseline and she denies any chest pain. Patient's nurse mentions she has been having pain along her right foot and swelling along her right foot which is mostly isolated at the top of her foot.  Denies any swelling or pain along her legs or calves. No recent orthopnea, PND or lower extremity edema. She has been experiencing intermittent nausea.  She reports that weight was previously 161 lbs earlier this week but was 166 lbs today. They are obtaining daily weights on the patient.   The patient does not have symptoms concerning for COVID-19 infection (fever, chills, cough, or new shortness of breath).    Past Medical History:  Diagnosis Date  . Arthritis   . Barrett esophagus   . Brachiocephalic artery stenosis, right (HCC)    PER PT TOLD POSSIBLE PREVIOUS TIA DUE TO HX VISUAL DISTURBANCE  . CAD (coronary artery disease)    a. s/p NSTEMI in 02/2019 with medical management recommended due to her advanced age and recent CVA  . CHF (congestive heart failure) (HCC)    a. EF 35-40% by echo in 02/2019  . Common carotid artery stenosis    RIGHT  . Complication of anesthesia  PONV  . Cystitis   . DDD (degenerative disc disease), lumbar   . Diverticulosis   . Fluid retention in legs   . GERD (gastroesophageal reflux disease)   . Glaucoma of both eyes   . H/O hiatal hernia   . Headache(784.0)   . History of esophageal dilatation    2007  . History of kidney stones   . Left inguinal hernia   . Nocturia   . Occlusion of right vertebral artery without cerebral infarction    NEUROLOGIST-  DR PENUMALLI ([facility])  . Urgency of urination   . Wears glasses    Past Surgical History:  Procedure Laterality Date  . APPENDECTOMY  1945  . CATARACT EXTRACTION W/ INTRAOCULAR LENS  IMPLANT, BILATERAL  2001  . CHOLECYSTECTOMY  1994  . CYSTOSCOPY WITH BIOPSY N/A 02/24/2013   Procedure: CYSTOSCOPY FULGERATION AND BLADDER BIOPSY;  Surgeon: Martina Sinner, MD;  Location: [facility];  Service: Urology;  Laterality: N/A;  . DIRECT LARYNGOSCOPY  07-30-2008   W/ REMOVAL RIGHT VALLECULAR CYST  . REMOVAL EAR CANAL CYST  1968  . RIGHT OVARIAN CYSTECTOMY  1948  . TEMPORAL ARTERY BIOPSY / LIGATION Left 01-03-2009  . TONSILLECTOMY  1945  . TUBAL LIGATION       Current Meds  Medication Sig  . acetaminophen (TYLENOL) 325 MG tablet Take 650 mg by mouth every 6 (six) hours as needed for pain.  Marland Kitchen alendronate (FOSAMAX) 70 MG tablet Take 70 mg by mouth every 7 (seven) days. Take with a full glass of water on an empty stomach.  Marland Kitchen atorvastatin (LIPITOR) 80 MG tablet Take 1 tablet (80 mg total) by mouth every evening.  . bimatoprost (LUMIGAN) 0.01 % SOLN Place 1 drop into both eyes at bedtime.   . Calcium Carb-Cholecalciferol (CALCIUM 600/VITAMIN D3) 600-800 MG-UNIT TABS Take 1 tablet by mouth daily.  . clopidogrel (PLAVIX) 75 MG tablet Take 1 tablet (75 mg total) by mouth daily.  . dorzolamide-timolol (COSOPT) 22.3-6.8 MG/ML ophthalmic solution Place 1 drop into both eyes 2 (two) times daily.  . feeding supplement, ENSURE ENLIVE, (ENSURE ENLIVE) LIQD Take 237 mLs by mouth 2 (two) times daily between meals.  . furosemide (LASIX) 20 MG tablet Take 1 tablet (20 mg total) by mouth daily.  . metoprolol succinate (TOPROL-XL) 25 MG 24 hr tablet Take 1 tablet (25 mg total) by mouth daily.  Marland Kitchen omeprazole (PRILOSEC) 20 MG capsule Take 20 mg by mouth daily.   . ondansetron (ZOFRAN ODT) 4 MG disintegrating tablet Take 1 tablet (4 mg total) by mouth every 8 (eight) hours as needed for nausea or vomiting.  .  potassium chloride (K-DUR) 10 MEQ tablet Take 20 mEq by mouth 2 (two) times daily.   . traZODone (DESYREL) 50 MG tablet Take 25 mg by mouth at bedtime.  . triamcinolone cream (KENALOG) 0.1 % Apply 1 application topically as needed.  . trolamine salicylate (ASPERCREME) 10 % cream Apply 1 application topically as needed.      Allergies:   Aspirin, Penicillins, and Ciprofloxacin   Social History   Tobacco Use  . Smoking status: Former Smoker    Years: 15.00    Types: Cigarettes    Quit date: 02/20/1952    Years since quitting: 67.1  . Smokeless tobacco: Never Used  Substance Use Topics  . Alcohol use: No  . Drug use: No     Family Hx: The patient's family history includes Cancer in her mother.  ROS:  Please see the history of present illness.     All other systems reviewed and are negative.   Prior CV studies:   The following studies were reviewed today:  Echocardiogram: 02/09/2019 IMPRESSIONS    1. Left ventricular ejection fraction, by visual estimation, is 35 to 40%. The left ventricle has moderately decreased function.  2. Difficult acoustic windows. LVEF is depressed at approximately 35 to 40% with severe hypokinesis/akinesis of the distal anterior, mid/distal anterolateral, distal inferior and apical walls.  3. Global right ventricle has normal systolic function.The right ventricular size is normal. No increase in right ventricular wall thickness.  4. Left atrial size was severely dilated.  5. Right atrial size was normal.  6. Mild mitral annular calcification.  7. The mitral valve is abnormal. Trace mitral valve regurgitation.  8. The tricuspid valve is normal in structure. Tricuspid valve regurgitation mild-moderate.  9. The aortic valve is tricuspid Aortic valve regurgitation was not visualized by color flow Doppler. Mild aortic valve sclerosis without stenosis. 10. The pulmonic valve was not well visualized. Pulmonic valve regurgitation is trivial by color flow  Doppler 11. Severely elevated pulmonary artery systolic JOACZYSA(>63 mm Hg).  Labs/Other Tests and Data Reviewed:    EKG:  No ECG reviewed.  Recent Labs: 02/08/2019: B Natriuretic Peptide 176.0 02/09/2019: TSH 2.043 02/10/2019: ALT 13; BUN 20; Creatinine, Ser 0.99; Hemoglobin 14.7; Platelets 196; Potassium 3.6; Sodium 138   Recent Lipid Panel Lab Results  Component Value Date/Time   CHOL 151 02/09/2019 06:59 AM   TRIG 116 02/09/2019 06:59 AM   HDL 54 02/09/2019 06:59 AM   CHOLHDL 2.8 02/09/2019 06:59 AM   LDLCALC 74 02/09/2019 06:59 AM    Wt Readings from Last 3 Encounters:  03/27/19 161 lb (73 kg)  03/03/19 173 lb (78.5 kg)  02/12/19 176 lb 12.9 oz (80.2 kg)     Objective:    Vital Signs:  BP 124/80   Pulse 76   Temp 97.8 F (36.6 C)   Resp 16   Wt 161 lb (73 kg)   SpO2 97%   BMI 28.52 kg/m    Telephone Visit - Physical Exam not performed.   ASSESSMENT & PLAN:    1. CAD - s/p NSTEMI in 02/2019 with medical management recommended due to her advanced age and recent CVA. - No reports of recent chest pain or dyspnea on exertion. Continue current regimen with Plavix, statin and BB therapy.   2. Chronic Combined Systolic and Diastolic CHF - EF 01-60% by echo in 02/2019. She denies any recent orthopnea, PND or changes in dyspnea but has experienced some edema which it sounds like is mostly isolated to the top of her right foot. Given her recent weight change, I recommended that she continue Lasix 20 mg daily but could take an extra tablet if weight increases by 2-3 lbs overnight or 5 lbs in one week. Continue Toprol-XL 25mg  daily. Not on ACE-I/ARB/ARNI at this time due to intermittent episodes of hypotension.   3. HLD - FLP during recent admission showed total cholesterol 151, triglycerides 116, HDL 54 and LDL 74. Continue current regimen with Atorvastatin 80 mg daily.  4. Recent CVA - she has been residing at [facility] since hospital discharge and is receiving PT and OT.     COVID-19 Education: The signs and symptoms of COVID-19 were discussed with the patient and how to seek care for testing (follow up with PCP or arrange E-visit).  The importance of social distancing was discussed today.  Time:   Today, I have spent 16 minutes with the patient with telehealth technology discussing the above problems.     Medication Adjustments/Labs and Tests Ordered: Current medicines are reviewed at length with the patient today.  Concerns regarding medicines are outlined above.   Tests Ordered: No orders of the defined types were placed in this encounter.   Medication Changes: Meds ordered this encounter  Medications  . furosemide (LASIX) 20 MG tablet    Sig: Take 1 tablet (20 mg total) by mouth daily. May take an extra tablet for weight gain of 2-3 lbs overnight or 5 lbs in one week    Dispense:  90 tablet    Refill:  3    Follow Up:  Virtual Visit  in 2 month(s)  Signed, Ellsworth LennoxBrittany M Ammie Warrick, PA-C  03/27/2019 4:29 PM     [facility]

## 2019-03-27 NOTE — Patient Instructions (Signed)
Medication Instructions:  Your physician recommends that you continue on your current medications as directed. Please refer to the Current Medication list given to you today. May take an extra lasix for weight gain of 2-3 lbs overnight or 5 lbs in one week.  *If you need a refill on your cardiac medications before your next appointment, please call your pharmacy*  Lab Work: NONE  If you have labs (blood work) drawn today and your tests are completely normal, you will receive your results only by: Marland Kitchen MyChart Message (if you have MyChart) OR . A paper copy in the mail If you have any lab test that is abnormal or we need to change your treatment, we will call you to review the results.  Testing/Procedures: NONE   Follow-Up: At [facility], you and your health needs are our priority.  As part of our continuing mission to provide you with exceptional heart care, we have created designated Provider Care Teams.  These Care Teams include your primary Cardiologist (physician) and Advanced Practice Providers (APPs -  Physician Assistants and Nurse Practitioners) who all work together to provide you with the care you need, when you need it.  Your next appointment:   2 month(s)  The format for your next appointment:   Virtual Visit   Provider:   You may see Dorris Carnes, MD or one of the following Advanced Practice Providers on your designated Care Team:    Bernerd Pho, PA-C   Ermalinda Barrios, PA-C    Other Instructions Thank you for choosing [facility]!

## 2019-06-02 ENCOUNTER — Telehealth: Payer: Medicare Other | Admitting: Student

## 2019-06-05 ENCOUNTER — Telehealth (INDEPENDENT_AMBULATORY_CARE_PROVIDER_SITE_OTHER): Payer: Medicare Other | Admitting: Internal Medicine

## 2019-06-05 VITALS — BP 126/84 | HR 70 | Temp 98.2°F | Resp 20 | Wt 155.7 lb

## 2019-06-05 DIAGNOSIS — Z79899 Other long term (current) drug therapy: Secondary | ICD-10-CM

## 2019-06-05 DIAGNOSIS — I5042 Chronic combined systolic (congestive) and diastolic (congestive) heart failure: Secondary | ICD-10-CM

## 2019-06-05 NOTE — Patient Instructions (Signed)
Medication Instructions:  Your physician recommends that you continue on your current medications as directed. Please refer to the Current Medication list given to you today.  *If you need a refill on your cardiac medications before your next appointment, please call your pharmacy*  Lab Work: Your physician recommends that you return for lab work in: (BMET, CBC, BNP)   If you have labs (blood work) drawn today and your tests are completely normal, you will receive your results only by: Marland Kitchen MyChart Message (if you have MyChart) OR . A paper copy in the mail If you have any lab test that is abnormal or we need to change your treatment, we will call you to review the results.  Testing/Procedures: NONE   Follow-Up: At [facility], you and your health needs are our priority.  As part of our continuing mission to provide you with exceptional heart care, we have created designated Provider Care Teams.  These Care Teams include your primary Cardiologist (physician) and Advanced Practice Providers (APPs -  Physician Assistants and Nurse Practitioners) who all work together to provide you with the care you need, when you need it.  Your next appointment:   3 month(s)  The format for your next appointment:   Either In Person or Virtual  Provider:   Dietrich Pates, MD  Other Instructions Thank you for choosing [facility]!

## 2019-06-05 NOTE — Progress Notes (Signed)
Virtual Visit via Telephone Note   This visit type was conducted due to national recommendations for restrictions regarding the COVID-19 Pandemic (e.g. social distancing) in an effort to limit this patient's exposure and mitigate transmission in our community.  Due to her co-morbid illnesses, this patient is at least at moderate risk for complications without adequate follow up.  This format is felt to be most appropriate for this patient at this time.  The patient did not have access to video technology/had technical difficulties with video requiring transitioning to audio format only (telephone).  All issues noted in this document were discussed and addressed.  No physical exam could be performed with this format.  Please refer to the patient's chart for her  consent to telehealth for [facility].   Date:  06/05/2019   ID:  Linda Donaldson, DOB Sep 08, 1922, MRN [id number]  Patient Location: Home Provider Location: Office  PCP:  Elias Else, MD  Cardiologist:  Dietrich Pates, MD  Electrophysiologist:  None   Evaluation Performed:  Follow-Up Visit  Chief Complaint:  F/U of CAD   History of Present Illness:    Linda Donaldson is a 86 y.o. female who was admitted with weatkness, fatiguability and SOB in October 2020   She was seen by cardiology for signif elevation of trop, NSTEMI  Medical management recommended due to her advanced age and recent CVA)  Pt denied CP Echo showed LVEF 35-40%.  The pt suffered from an acute right pontine CVA.  Th pt also has a hx of  HTN and HLD  I saw the pt in October when she was hospitalized   The pt has been seen by B Strader twice since     The pt is still in a facility with nursing   I spoke with Nurse  For this visit   She said the pt contracted COVID at the end of December and today  is coming off covid unit    Breathing is good  Sats 99%  Still has some bilateral edema  1+ Denies nausea No CP  The patient is recovering from COVID    Past  Medical History:  Diagnosis Date  . Arthritis   . Barrett esophagus   . Brachiocephalic artery stenosis, right (HCC)    PER PT TOLD POSSIBLE PREVIOUS TIA DUE TO HX VISUAL DISTURBANCE  . CAD (coronary artery disease)    a. s/p NSTEMI in 02/2019 with medical management recommended due to her advanced age and recent CVA  . CHF (congestive heart failure) (HCC)    a. EF 35-40% by echo in 02/2019  . Common carotid artery stenosis    RIGHT  . Complication of anesthesia    PONV  . Cystitis   . DDD (degenerative disc disease), lumbar   . Diverticulosis   . Fluid retention in legs   . GERD (gastroesophageal reflux disease)   . Glaucoma of both eyes   . H/O hiatal hernia   . Headache(784.0)   . History of esophageal dilatation    2007  . History of kidney stones   . Left inguinal hernia   . Nocturia   . Occlusion of right vertebral artery without cerebral infarction    NEUROLOGIST-  DR PENUMALLI ([facility])  . Urgency of urination   . Wears glasses    Past Surgical History:  Procedure Laterality Date  . APPENDECTOMY  1945  . CATARACT EXTRACTION W/ INTRAOCULAR LENS  IMPLANT, BILATERAL  2001  . CHOLECYSTECTOMY  [place] WITH BIOPSY N/A 02/24/2013   Procedure: CYSTOSCOPY FULGERATION AND BLADDER BIOPSY;  Surgeon: Reece Packer, MD;  Location: [facility];  Service: Urology;  Laterality: N/A;  . DIRECT LARYNGOSCOPY  07-30-2008   W/ REMOVAL RIGHT VALLECULAR CYST  . REMOVAL EAR CANAL CYST  1968  . RIGHT OVARIAN CYSTECTOMY  1948  . TEMPORAL ARTERY BIOPSY / LIGATION Left 01-03-2009  . TONSILLECTOMY  1945  . TUBAL LIGATION       Current Meds  Medication Sig  . acetaminophen (TYLENOL) 325 MG tablet Take 650 mg by mouth every 6 (six) hours as needed for pain.  Marland Kitchen alendronate (FOSAMAX) 70 MG tablet Take 70 mg by mouth every 7 (seven) days. Take with a full glass of water on an empty stomach.  . Ascorbic Acid (VITAMIN C) 100 MG tablet Take 100 mg by mouth  daily.  Marland Kitchen atorvastatin (LIPITOR) 80 MG tablet Take 1 tablet (80 mg total) by mouth every evening.  . bimatoprost (LUMIGAN) 0.01 % SOLN Place 1 drop into both eyes at bedtime.   . Calcium Carb-Cholecalciferol (CALCIUM 600/VITAMIN D3) 600-800 MG-UNIT TABS Take 1 tablet by mouth daily.  . clopidogrel (PLAVIX) 75 MG tablet Take 1 tablet (75 mg total) by mouth daily.  . dorzolamide-timolol (COSOPT) 22.3-6.8 MG/ML ophthalmic solution Place 1 drop into both eyes 2 (two) times daily.  . feeding supplement, ENSURE ENLIVE, (ENSURE ENLIVE) LIQD Take 237 mLs by mouth 2 (two) times daily between meals.  . furosemide (LASIX) 20 MG tablet Take 1 tablet (20 mg total) by mouth daily.  . furosemide (LASIX) 20 MG tablet Take 1 tablet (20 mg total) by mouth daily. May take an extra tablet for weight gain of 2-3 lbs overnight or 5 lbs in one week  . magnesium oxide (MAG-OX) 400 MG tablet Take 400 mg by mouth 2 (two) times daily.  . metoprolol succinate (TOPROL-XL) 25 MG 24 hr tablet Take 1 tablet (25 mg total) by mouth daily.  Marland Kitchen omeprazole (PRILOSEC) 20 MG capsule Take 20 mg by mouth daily.   . ondansetron (ZOFRAN ODT) 4 MG disintegrating tablet Take 1 tablet (4 mg total) by mouth every 8 (eight) hours as needed for nausea or vomiting.  . potassium chloride (K-DUR) 10 MEQ tablet Take 20 mEq by mouth 2 (two) times daily.   Marland Kitchen QUERCETIN PO Take by mouth.  . traZODone (DESYREL) 50 MG tablet Take 25 mg by mouth at bedtime.  . triamcinolone cream (KENALOG) 0.1 % Apply 1 application topically as needed.  . trolamine salicylate (ASPERCREME) 10 % cream Apply 1 application topically as needed.   . zinc gluconate 50 MG tablet Take 50 mg by mouth daily.     Allergies:   Aspirin, Penicillins, and Ciprofloxacin   Social History   Tobacco Use  . Smoking status: Former Smoker    Years: 15.00    Types: Cigarettes    Quit date: 02/20/1952    Years since quitting: 67.3  . Smokeless tobacco: Never Used  Substance Use Topics    . Alcohol use: No  . Drug use: No     Family Hx: The patient's family history includes Cancer in her mother.  ROS:   Please see the history of present illness.     All other systems reviewed and are negative.   Prior CV studies:   The following studies were reviewed today: 1. Left ventricular ejection fraction, by visual estimation, is 35 to 40%. The left ventricle has  moderately decreased function. 2. Difficult acoustic windows. LVEF is depressed at approximately 35 to 40% with severe hypokinesis/akinesis of the distal anterior, mid/distal anterolateral, distal inferior and apical walls. 3. Global right ventricle has normal systolic function.The right ventricular size is normal. No increase in right ventricular wall thickness. 4. Left atrial size was severely dilated. 5. Right atrial size was normal. 6. Mild mitral annular calcification. 7. The mitral valve is abnormal. Trace mitral valve regurgitation. 8. The tricuspid valve is normal in structure. Tricuspid valve regurgitation mild-moderate. 9. The aortic valve is tricuspid Aortic valve regurgitation was not visualized by color flow Doppler. Mild aortic valve sclerosis without stenosis. 10. The pulmonic valve was not well visualized. Pulmonic valve regurgitation is trivial by color flow Doppler 11. Severely elevated pulmonary artery systolic pressure(>60 mm Hg).    Labs/Other Tests and Data Reviewed:    EKG:  No ECG reviewed.  Recent Labs: 02/08/2019: B Natriuretic Peptide 176.0 02/09/2019: TSH 2.043 02/10/2019: ALT 13; BUN 20; Creatinine, Ser 0.99; Hemoglobin 14.7; Platelets 196; Potassium 3.6; Sodium 138   Recent Lipid Panel Lab Results  Component Value Date/Time   CHOL 151 02/09/2019 06:59 AM   TRIG 116 02/09/2019 06:59 AM   HDL 54 02/09/2019 06:59 AM   CHOLHDL 2.8 02/09/2019 06:59 AM   LDLCALC 74 02/09/2019 06:59 AM    Wt Readings from Last 3 Encounters:  06/05/19 155 lb 11.2 oz (70.6 kg)  03/27/19  161 lb (73 kg)  03/03/19 173 lb (78.5 kg)     Objective:    Vital Signs:  BP 126/84   Pulse 70   Temp 98.2 F (36.8 C)   Resp 20   Wt 155 lb 11.2 oz (70.6 kg)   SpO2 94%   BMI 27.58 kg/m    VITAL SIGNS:  reviewed  ASSESSMENT & PLAN:    1. Chronic systolic CHF   Pt's wt is down over past month.  Some may be due to COVID illinss   Her sats are good but per nurse she has edema   Per nurse pt is comfortable   I would recomm checking CBC and BMET and BNP  Keepon same meds   2.  Hx NSTEMI  Probable CAD    Treat medically  Pt is without angina  3  Hx CVA   On plavix  4   HL  Last LDL 74  On statin     5  Hx COVID   Per nursing, the pt got sick 2 wks ago  Being transferred off their covid unit  Getting better     COVID-19 Education: The signs and symptoms of COVID-19 were discussed with the patient and how to seek care for testing (follow up with PCP or arrange E-visit).  The importance of social distancing was discussed today.  Time:   Today, I have spent 20 minutes with the patient with telehealth technology discussing the above problems.     Medication Adjustments/Labs and Tests Ordered: Current medicines are reviewed at length with the patient today.  Concerns regarding medicines are outlined above.   Tests Ordered: No orders of the defined types were placed in this encounter.   Medication Changes: No orders of the defined types were placed in this encounter.   Follow Up:  APril 2021  SignedDietrich Pates, MD  06/05/2019 11:32 AM    [facility]

## 2019-06-11 ENCOUNTER — Telehealth: Payer: Self-pay

## 2019-06-11 NOTE — Telephone Encounter (Signed)
I spoke with  Georgian Co at facility, pt has not had labs done from 06/05/2019 visit yet.She has our fax number to send results.

## 2019-06-11 NOTE — Telephone Encounter (Signed)
Facility Caregiver for Pt.[facility]) would like to know if the facility need to draw Pt's labs before appt on 08-28-19 or will the labs be drawn at time of visit with Strader,PA-C. Please return call to [facility] [phone]   thanks renee

## 2019-08-28 ENCOUNTER — Ambulatory Visit: Payer: Medicare Other | Admitting: Student

## 2020-10-05 DEATH — deceased
